# Patient Record
Sex: Male | Born: 1965 | Race: White | Hispanic: No | Marital: Single | Smoking: Current every day smoker
Health system: Southern US, Community
[De-identification: ages and names within clinical notes are randomized; demographics above are authoritative.]

## PROBLEM LIST (undated history)

## (undated) DIAGNOSIS — M1990 Unspecified osteoarthritis, unspecified site: Secondary | ICD-10-CM

## (undated) DIAGNOSIS — K4090 Unilateral inguinal hernia, without obstruction or gangrene, not specified as recurrent: Secondary | ICD-10-CM

## (undated) DIAGNOSIS — I252 Old myocardial infarction: Secondary | ICD-10-CM

## (undated) HISTORY — PX: HERNIA REPAIR: SHX51

---

## 2013-12-19 ENCOUNTER — Emergency Department (HOSPITAL_COMMUNITY)
Admission: EM | Admit: 2013-12-19 | Discharge: 2013-12-19 | Disposition: A | Discharge status: Home / Self Care | Payer: 59 | Attending: Emergency Medicine | Admitting: Emergency Medicine

## 2013-12-19 ENCOUNTER — Encounter (HOSPITAL_COMMUNITY): Payer: Self-pay | Admitting: Emergency Medicine

## 2013-12-19 ENCOUNTER — Emergency Department (HOSPITAL_COMMUNITY): Payer: 59

## 2013-12-19 DIAGNOSIS — G8929 Other chronic pain: Secondary | ICD-10-CM | POA: Insufficient documentation

## 2013-12-19 DIAGNOSIS — M549 Dorsalgia, unspecified: Secondary | ICD-10-CM | POA: Insufficient documentation

## 2013-12-19 DIAGNOSIS — R209 Unspecified disturbances of skin sensation: Secondary | ICD-10-CM | POA: Insufficient documentation

## 2013-12-19 DIAGNOSIS — R52 Pain, unspecified: Secondary | ICD-10-CM | POA: Insufficient documentation

## 2013-12-19 DIAGNOSIS — M129 Arthropathy, unspecified: Secondary | ICD-10-CM | POA: Insufficient documentation

## 2013-12-19 DIAGNOSIS — M25519 Pain in unspecified shoulder: Secondary | ICD-10-CM | POA: Insufficient documentation

## 2013-12-19 HISTORY — DX: Unspecified osteoarthritis, unspecified site: M19.90

## 2013-12-19 MED ORDER — HYDROCODONE-ACETAMINOPHEN 5-325 MG PO TABS: 1.0000 | ORAL_TABLET | Freq: Four times a day (QID) | ORAL | Status: DC | PRN

## 2013-12-19 MED ORDER — HYDROCODONE-ACETAMINOPHEN 5-325 MG PO TABS
2.0000 | ORAL_TABLET | Freq: Once | ORAL | Status: AC
  Administered 2013-12-19: 2 via ORAL
  Filled 2013-12-19: qty 2

## 2013-12-19 NOTE — ED Provider Notes (Signed)
CSN: [id number]     Arrival date & time 12/19/13  1824 History  This chart was scribed for Roxy Horsemanobert Edin Skarda, PA working with Flint MelterElliott L Wentz, MD, by Bronson CurbJacqueline Melvin, ED Scribe. This patient was seen in room TR10C/TR10C and the patient's care was started at 7:54 PM.    Chief Complaint  Patient presents with  . Shoulder Pain     The history is provided by the patient. No language interpreter was used.   HPI Comments: Kevin Diaz is a 48 y.o. male who presents to the Emergency Department with a chief complaint of moderate right shoulder pain that began today. He reports moving furniture earlier today and believes he the pain to be a result of over-exertion. Patient describes the pain as stabbing and radiates through his right upper back and down to his right wrist. There is associated tingling in his fingertips. He states he has taken Ibuprofen with liittle to no relief. He reports a history of similar shoulder pain throughout the years and stated 3 years ago was diagnosed with degenerative osteoporosis.   Past Medical History  Diagnosis Date  . Arthritis    No past surgical history on file. No family history on file. History  Substance Use Topics  . Smoking status: Not on file  . Smokeless tobacco: Not on file  . Alcohol Use: Not on file    Review of Systems  Constitutional: Negative for fever and chills.  Gastrointestinal:       No bowel incontinence  Genitourinary:       No urinary incontinence  Musculoskeletal: Positive for arthralgias, back pain and myalgias.  Neurological:       No saddle anesthesia      Allergies  Coconut fatty acids  Home Medications   Prior to Admission medications   Not on File   Triage Vitals: BP 118/60  Pulse 73  Temp(Src) 97.5 F (36.4 C) (Oral)  Resp 18  SpO2 95%  Physical Exam  Nursing note and vitals reviewed. Constitutional: He is oriented to person, place, and time. He appears well-developed and well-nourished. No distress.   HENT:  Head: Normocephalic and atraumatic.  Eyes: Conjunctivae and EOM are normal.  Neck: Normal range of motion. Neck supple. No tracheal deviation present.  Cardiovascular: Normal rate and intact distal pulses.   Brisk cap refill  Pulmonary/Chest: Effort normal. No respiratory distress.  Abdominal: He exhibits no distension.  Musculoskeletal: Normal range of motion.  Right shoulder ttp over the anterior aspect, ROM and strength is reduced 2/2 pain  Neurological: He is alert and oriented to person, place, and time.  Sensation intact  Skin: Skin is warm and dry.  Psychiatric: He has a normal mood and affect. His behavior is normal. Judgment and thought content normal.    ED Course  Procedures (including critical care time)  DIAGNOSTIC STUDIES: Oxygen Saturation is 95% on room air, adequate by my interpretation.    COORDINATION OF CARE: At 8:00 PM Discussed treatment plan with patient which includes Norco/Vicodin and shoulder immobilzer. Patient agrees.   Imaging Review Dg Shoulder Right  12/19/2013   CLINICAL DATA:  SHOULDER PAIN  EXAM: RIGHT SHOULDER - 2+ VIEW  COMPARISON:  None.  FINDINGS: There is no evidence of fracture or dislocation. Mild areas of hypertrophic spurring along the glenoid and undersurface of the acromion and distal clavicle.  IMPRESSION: Mild osteoarthritic changes.  No acute osseous abnormalities.   Electronically Signed   By: Salome HolmesHector  Cooper M.D.   On: 12/19/2013 19:59  MDM   Final diagnoses:  Shoulder pain    Patient with acute on chronic shoulder pain.  Plain films negative for acute injury.  Will give sling, pain meds, and ortho follow-up.  I personally performed the services described in this documentation, which was scribed in my presence. The recorded information has been reviewed and is accurate.     Roxy Horsemanobert Vamsi Apfel, PA-C 12/19/13 2015  Roxy Horsemanobert Kayhan Boardley, PA-C 12/19/13 2016

## 2013-12-19 NOTE — Discharge Instructions (Signed)
Acromioclavicular Injuries  The AC (acromioclavicular) joint is the joint in the shoulder where the collarbone (clavicle) meets the shoulder blade (scapula). The part of the shoulder blade connected to the collarbone is called the acromion. Common problems with and treatments for the AC joint are detailed below.  ARTHRITIS  Arthritis occurs when the joint has been injured and the smooth padding between the joints (cartilage) is lost. This is the wear and tear seen in most joints of the body if they have been overused. This causes the joint to produce pain and swelling which is worse with activity.   AC JOINT SEPARATION  AC joint separation means that the ligaments connecting the acromion of the shoulder blade and collarbone have been damaged, and the two bones no longer line up. AC separations can be anywhere from mild to severe, and are "graded" depending upon which ligaments are torn and how badly they are torn.   Grade I Injury: the least damage is done, and the AC joint still lines up.   Grade II Injury: damage to the ligaments which reinforce the AC joint. In a Grade II injury, these ligaments are stretched but not entirely torn. When stressed, the AC joint becomes painful and unstable.   Grade III Injury: AC and secondary ligaments are completely torn, and the collarbone is no longer attached to the shoulder blade. This results in deformity; a prominence of the end of the clavicle.  AC JOINT FRACTURE  AC joint fracture means that there has been a break in the bones of the AC joint, usually the end of the clavicle.  TREATMENT  TREATMENT OF AC ARTHRITIS   There is currently no way to replace the cartilage damaged by arthritis. The best way to improve the condition is to decrease the activities which aggravate the problem. Application of ice to the joint helps decrease pain and soreness (inflammation). The use of non-steroidal anti-inflammatory medication is helpful.   If less conservative measures do not  work, then cortisone shots (injections) may be used. These are anti-inflammatories; they decrease the soreness in the joint and swelling.   If non-surgical measures fail, surgery may be recommended. The procedure is generally removal of a portion of the end of the clavicle. This is the part of the collarbone closest to your acromion which is stabilized with ligaments to the acromion of the shoulder blade. This surgery may be performed using a tube-like instrument with a light (arthroscope) for looking into a joint. It may also be performed as an open surgery through a small incision by the surgeon. Most patients will have good range of motion within 6 weeks and may return to all activity including sports by 8-12 weeks, barring complications.  TREATMENT OF AN AC SEPARATION   The initial treatment is to decrease pain. This is best accomplished by immobilizing the arm in a sling and placing an ice pack to the shoulder for 20 to 30 minutes every 2 hours as needed. As the pain starts to subside, it is important to begin moving the fingers, wrist, elbow and eventually the shoulder in order to prevent a stiff or "frozen" shoulder. Instruction on when and how much to move the shoulder will be provided by your caregiver. The length of time needed to regain full motion and function depends on the amount or grade of the injury. Recovery from a Grade I AC separation usually takes 10 to 14 days, whereas a Grade III may take 6 to 8 weeks.   Grade   I and II separations usually do not require surgery. Even Grade III injuries usually allow return to full activity with few restrictions. Treatment is also based on the activity demands of the injured shoulder. For example, a high level quarterback with an injured throwing arm will receive more aggressive treatment than someone with a desk job who rarely uses his/her arm for strenuous activities. In some cases, a painful lump may persist which could require a later surgery. Surgery  can be very successful, but the benefits must be weighed against the potential risks.  TREATMENT OF AN AC JOINT FRACTURE  Fracture treatment depends on the type of fracture. Sometimes a splint or sling may be all that is required. Other times surgery may be required for repair. This is more frequently the case when the ligaments supporting the clavicle are completely torn. Your caregiver will help you with these decisions and together you can decide what will be the best treatment.  HOME CARE INSTRUCTIONS    Apply ice to the injury for 15-20 minutes each hour while awake for 2 days. Put the ice in a plastic bag and place a towel between the bag of ice and skin.   If a sling has been applied, wear it constantly for as long as directed by your caregiver, even at night. The sling or splint can be removed for bathing or showering or as directed. Be sure to keep the shoulder in the same place as when the sling is on. Do not lift the arm.   If a figure-of-eight splint has been applied it should be tightened gently by another person every day. Tighten it enough to keep the shoulders held back. Allow enough room to place the index finger between the body and strap. Loosen the splint immediately if there is numbness or tingling in the hands.   Take over-the-counter or prescription medicines for pain, discomfort or fever as directed by your caregiver.   If you or your child has received a follow up appointment, it is very important to keep that appointment in order to avoid long term complications, chronic pain or disability.  SEEK MEDICAL CARE IF:    The pain is not relieved with medications.   There is increased swelling or discoloration that continues to get worse rather than better.   You or your child has been unable to follow up as instructed.   There is progressive numbness and tingling in the arm, forearm or hand.  SEEK IMMEDIATE MEDICAL CARE IF:    The arm is numb, cold or pale.   There is increasing pain  in the hand, forearm or fingers.  MAKE SURE YOU:    Understand these instructions.   Will watch your condition.   Will get help right away if you are not doing well or get worse.  Document Released: 05/18/2005 Document Revised: 10/31/2011 Document Reviewed: 11/10/2008  Exit[employer] Patient Information 2014 [employer].

## 2013-12-19 NOTE — ED Notes (Signed)
Pt reports R shoulder pain today; has bilateral shoulder pain chronically. Reports he has taken ibuprofen and muscle relaxers and that usually helps. Was moving furniture today and thinks he over did it. Reports he can barely move his shoulder due to pain and is having tingling in fingertips.

## 2013-12-20 NOTE — ED Provider Notes (Signed)
Medical screening examination/treatment/procedure(s) were performed by non-physician practitioner and as supervising physician I was immediately available for consultation/collaboration.  Flint MelterElliott L Paulanthony Gleaves, MD 12/20/13 0040

## 2014-11-23 ENCOUNTER — Encounter (HOSPITAL_COMMUNITY): Payer: Self-pay

## 2014-11-23 ENCOUNTER — Emergency Department (HOSPITAL_COMMUNITY)
Admission: EM | Admit: 2014-11-23 | Discharge: 2014-11-23 | Disposition: A | Discharge status: Home / Self Care | Payer: Self-pay | Attending: Emergency Medicine | Admitting: Emergency Medicine

## 2014-11-23 DIAGNOSIS — M1990 Unspecified osteoarthritis, unspecified site: Secondary | ICD-10-CM | POA: Insufficient documentation

## 2014-11-23 DIAGNOSIS — Z79899 Other long term (current) drug therapy: Secondary | ICD-10-CM | POA: Insufficient documentation

## 2014-11-23 DIAGNOSIS — K4090 Unilateral inguinal hernia, without obstruction or gangrene, not specified as recurrent: Secondary | ICD-10-CM | POA: Insufficient documentation

## 2014-11-23 DIAGNOSIS — Z720 Tobacco use: Secondary | ICD-10-CM | POA: Insufficient documentation

## 2014-11-23 NOTE — Discharge Instructions (Signed)
Hernia °A hernia occurs when an internal organ pushes out through a weak spot in the abdominal wall. Hernias most commonly occur in the groin and around the navel. Hernias often can be pushed back into place (reduced). Most hernias tend to get worse over time. Some abdominal hernias can get stuck in the opening (irreducible or incarcerated hernia) and cannot be reduced. An irreducible abdominal hernia which is tightly squeezed into the opening is at risk for impaired blood supply (strangulated hernia). A strangulated hernia is a medical emergency. Because of the risk for an irreducible or strangulated hernia, surgery may be recommended to repair a hernia. °CAUSES  °· Heavy lifting. °· Prolonged coughing. °· Straining to have a bowel movement. °· A cut (incision) made during an abdominal surgery. °HOME CARE INSTRUCTIONS  °· Bed rest is not required. You may continue your normal activities. °· Avoid lifting more than 10 pounds (4.5 kg) or straining. °· Cough gently. If you are a smoker it is best to stop. Even the best hernia repair can break down with the continual strain of coughing. Even if you do not have your hernia repaired, a cough will continue to aggravate the problem. °· Do not wear anything tight over your hernia. Do not try to keep it in with an outside bandage or truss. These can damage abdominal contents if they are trapped within the hernia sac. °· Eat a normal diet. °· Avoid constipation. Straining over long periods of time will increase hernia size and encourage breakdown of repairs. If you cannot do this with diet alone, stool softeners may be used. °SEEK IMMEDIATE MEDICAL CARE IF:  °· You have a fever. °· You develop increasing abdominal pain. °· You feel nauseous or vomit. °· Your hernia is stuck outside the abdomen, looks discolored, feels hard, or is tender. °· You have any changes in your bowel habits or in the hernia that are unusual for you. °· You have increased pain or swelling around the  hernia. °· You cannot push the hernia back in place by applying gentle pressure while lying down. °MAKE SURE YOU:  °· Understand these instructions. °· Will watch your condition. °· Will get help right away if you are not doing well or get worse. °Document Released: 08/08/2005 Document Revised: 10/31/2011 Document Reviewed: 03/27/2008 °[employer]® Patient Information ©2015 [employer]. This information is not intended to replace advice given to you by your health care provider. Make sure you discuss any questions you have with your health care provider. ° °Inguinal Hernia, Adult °Muscles help keep everything in the body in its proper place. But if a weak spot in the muscles develops, something can poke through. That is called a hernia. When this happens in the lower part of the belly (abdomen), it is called an inguinal hernia. (It takes its name from a part of the body in this region called the inguinal canal.) A weak spot in the wall of muscles lets some fat or part of the small intestine bulge through. An inguinal hernia can develop at any age. Men get them more often than women. °CAUSES  °In adults, an inguinal hernia develops over time. °· It can be triggered by: °¨ Suddenly straining the muscles of the lower abdomen. °¨ Lifting heavy objects. °¨ Straining to have a bowel movement. Difficult bowel movements (constipation) can lead to this. °¨ Constant coughing. This may be caused by smoking or lung disease. °¨ Being overweight. °¨ Being pregnant. °¨ Working at a job that requires long   periods of standing or heavy lifting. °¨ Having had an inguinal hernia before. °One type can be an emergency situation. It is called a strangulated inguinal hernia. It develops if part of the small intestine slips through the weak spot and cannot get back into the abdomen. The blood supply can be cut off. If that happens, part of the intestine may die. This situation requires emergency surgery. °SYMPTOMS  °Often, a small inguinal  hernia has no symptoms. It is found when a healthcare provider does a physical exam. Larger hernias usually have symptoms.  °· In adults, symptoms may include: °¨ A lump in the groin. This is easier to see when the person is standing. It might disappear when lying down. °¨ In men, a lump in the scrotum. °¨ Pain or burning in the groin. This occurs especially when lifting, straining or coughing. °¨ A dull ache or feeling of pressure in the groin. °· Signs of a strangulated hernia can include: °¨ A bulge in the groin that becomes very painful and tender to the touch. °¨ A bulge that turns red or purple. °¨ Fever, nausea and vomiting. °¨ Inability to have a bowel movement or to pass gas. °DIAGNOSIS  °To decide if you have an inguinal hernia, a healthcare provider will probably do a physical examination. °· This will include asking questions about any symptoms you have noticed. °· The healthcare provider might feel the groin area and ask you to cough. If an inguinal hernia is felt, the healthcare provider may try to slide it back into the abdomen. °· Usually no other tests are needed. °TREATMENT  °Treatments can vary. The size of the hernia makes a difference. Options include: °· Watchful waiting. This is often suggested if the hernia is small and you have had no symptoms. °¨ No medical procedure will be done unless symptoms develop. °¨ You will need to watch closely for symptoms. If any occur, contact your healthcare provider right away. °· Surgery. This is used if the hernia is larger or you have symptoms. °¨ Open surgery. This is usually an outpatient procedure (you will not stay overnight in a hospital). An cut (incision) is made through the skin in the groin. The hernia is put back inside the abdomen. The weak area in the muscles is then repaired by herniorrhaphy or hernioplasty. Herniorrhaphy: in this type of surgery, the weak muscles are sewn back together. Hernioplasty: a patch or mesh is used to close the weak  area in the abdominal wall. °¨ Laparoscopy. In this procedure, a surgeon makes small incisions. A thin tube with a tiny video camera (called a laparoscope) is put into the abdomen. The surgeon repairs the hernia with mesh by looking with the video camera and using two long instruments. °HOME CARE INSTRUCTIONS  °· After surgery to repair an inguinal hernia: °¨ You will need to take pain medicine prescribed by your healthcare provider. Follow all directions carefully. °¨ You will need to take care of the wound from the incision. °¨ Your activity will be restricted for awhile. This will probably include no heavy lifting for several weeks. You also should not do anything too active for a few weeks. When you can return to work will depend on the type of job that you have. °· During "watchful waiting" periods, you should: °¨ Maintain a healthy weight. °¨ Eat a diet high in fiber (fruits, vegetables and whole grains). °¨ Drink plenty of fluids to avoid constipation. This means drinking enough water and other   liquids to keep your urine clear or pale yellow. °¨ Do not lift heavy objects. °¨ Do not stand for long periods of time. °¨ Quit smoking. This should keep you from developing a frequent cough. °SEEK MEDICAL CARE IF:  °· A bulge develops in your groin area. °· You feel pain, a burning sensation or pressure in the groin. This might be worse if you are lifting or straining. °· You develop a fever of more than 100.5° F (38.1° C). °SEEK IMMEDIATE MEDICAL CARE IF:  °· Pain in the groin increases suddenly. °· A bulge in the groin gets bigger suddenly and does not go down. °· For men, there is sudden pain in the scrotum. Or, the size of the scrotum increases. °· A bulge in the groin area becomes red or purple and is painful to touch. °· You have nausea or vomiting that does not go away. °· You feel your heart beating much faster than normal. °· You cannot have a bowel movement or pass gas. °· You develop a fever of more than  102.0° F (38.9° C). °Document Released: 12/25/2008 Document Revised: 10/31/2011 Document Reviewed: 12/25/2008 °ExitCare® Patient Information ©2015 [employer]. This information is not intended to replace advice given to you by your health care provider. Make sure you discuss any questions you have with your health care provider. ° °

## 2014-11-23 NOTE — ED Notes (Signed)
Pt reports inguinal hernia x 2 weeks. Pain worse with coughing and standing. Reports it is reducible. Pt denies pain currently. NAD.

## 2014-11-23 NOTE — ED Provider Notes (Signed)
CSN: [id number]     Arrival date & time 11/23/14  1152 History   First MD Initiated Contact with Patient 11/23/14 1219     Chief Complaint  Patient presents with  . Inguinal Hernia     (Consider location/radiation/quality/duration/timing/severity/associated sxs/prior Treatment) HPI Comments: Patient here complaining of left inguinal pain 2 weeks. Symptoms worse with coughing or standing. He is able to push the bulge back in. No vomiting or diarrhea. No severe abdominal pain. No fever or chills. Is currently asymptomatic  The history is provided by the patient.    Past Medical History  Diagnosis Date  . Arthritis    History reviewed. No pertinent past surgical history. No family history on file. History  Substance Use Topics  . Smoking status: Current Every Day Smoker -- 1.00 packs/day    Types: Cigarettes  . Smokeless tobacco: Not on file  . Alcohol Use: No    Review of Systems  All other systems reviewed and are negative.     Allergies  Coconut fatty acids  Home Medications   Prior to Admission medications   Medication Sig Start Date End Date Taking? Authorizing Provider  HYDROcodone-acetaminophen (NORCO/VICODIN) 5-325 MG per tablet Take 1-2 tablets by mouth every 6 (six) hours as needed. 12/19/13   Roxy Horsemanobert Browning, PA-C   BP 112/55 mmHg  Pulse 91  Temp(Src) 97.6 F (36.4 C) (Oral)  Resp 16  Ht 5' 10.5" (1.791 m)  Wt 147 lb 3 oz (66.764 kg)  BMI 20.81 kg/m2  SpO2 97% Physical Exam  Constitutional: He is oriented to person, place, and time. He appears well-developed and well-nourished.  Non-toxic appearance. No distress.  HENT:  Head: Normocephalic and atraumatic.  Eyes: Conjunctivae, EOM and lids are normal. Pupils are equal, round, and reactive to light.  Neck: Normal range of motion. Neck supple. No tracheal deviation present. No thyroid mass present.  Cardiovascular: Normal rate, regular rhythm and normal heart sounds.  Exam reveals no gallop.   No  murmur heard. Pulmonary/Chest: Effort normal and breath sounds normal. No stridor. No respiratory distress. He has no decreased breath sounds. He has no wheezes. He has no rhonchi. He has no rales.  Abdominal: Soft. Normal appearance and bowel sounds are normal. He exhibits no distension. There is no tenderness. There is no rigidity, no rebound, no guarding and no CVA tenderness.  Genitourinary: Testes normal and penis normal. Cremasteric reflex is present.  Musculoskeletal: Normal range of motion. He exhibits no edema or tenderness.  Neurological: He is alert and oriented to person, place, and time. He has normal strength. No cranial nerve deficit or sensory deficit. GCS eye subscore is 4. GCS verbal subscore is 5. GCS motor subscore is 6.  Skin: Skin is warm and dry. No abrasion and no rash noted.  Psychiatric: He has a normal mood and affect. His speech is normal and behavior is normal.  Nursing note and vitals reviewed.   ED Course  Procedures (including critical care time) Labs Review Labs Reviewed - No data to display  Imaging Review No results found.   EKG Interpretation None      MDM   Final diagnoses:  None    Patient without evidence of incarcerated hernia at this time. Does have a mass with coughing in his left inguinal canal which likely represents a hernia. Will be given general surgery referral as well as return precautions    Lorre NickAnthony Reema Chick, MD 11/23/14 1306

## 2015-02-09 ENCOUNTER — Emergency Department (HOSPITAL_COMMUNITY)
Admission: EM | Admit: 2015-02-09 | Discharge: 2015-02-09 | Disposition: A | Discharge status: Home / Self Care | Payer: Self-pay | Attending: Emergency Medicine | Admitting: Emergency Medicine

## 2015-02-09 ENCOUNTER — Encounter (HOSPITAL_COMMUNITY): Payer: Self-pay | Admitting: Family Medicine

## 2015-02-09 DIAGNOSIS — K4090 Unilateral inguinal hernia, without obstruction or gangrene, not specified as recurrent: Secondary | ICD-10-CM | POA: Insufficient documentation

## 2015-02-09 DIAGNOSIS — M1990 Unspecified osteoarthritis, unspecified site: Secondary | ICD-10-CM | POA: Insufficient documentation

## 2015-02-09 DIAGNOSIS — Z720 Tobacco use: Secondary | ICD-10-CM | POA: Insufficient documentation

## 2015-02-09 MED ORDER — DOCUSATE SODIUM 100 MG PO CAPS
100.0000 mg | ORAL_CAPSULE | Freq: Two times a day (BID) | ORAL | Status: AC
Start: 2015-02-09 — End: ?

## 2015-02-09 MED ORDER — HYDROCODONE-ACETAMINOPHEN 5-325 MG PO TABS: 1.0000 | ORAL_TABLET | Freq: Four times a day (QID) | ORAL | Status: AC | PRN

## 2015-02-09 NOTE — Discharge Instructions (Signed)

## 2015-02-09 NOTE — ED Provider Notes (Signed)
CSN: [id number]     Arrival date & time 02/09/15  1314 History   First MD Initiated Contact with Patient 02/09/15 1456     Chief Complaint  Patient presents with  . Inguinal Hernia     (Consider location/radiation/quality/duration/timing/severity/associated sxs/prior Treatment) HPI  49 year old male presents with progressively worse left inguinal hernia pain. He has had this hernia for about 3 months but over the past 1 month has been worsening. He states it is constantly out and is causing him pain. He did vomit twice last night but no vomiting today. Is having normal bowel movements but has significant pain with bowel movements. Last bowel movement this morning. He occasionally takes ibuprofen but has been trying not to take anything. He was referred to general surgery as an outpatient but due to [facility] he has not been able to arrange follow-up as he is still in the insurance getting phase. Stands on his feet for 8 hours at work which increases pain. Coughing also increases pain.  Past Medical History  Diagnosis Date  . Arthritis    History reviewed. No pertinent past surgical history. History reviewed. No pertinent family history. History  Substance Use Topics  . Smoking status: Current Every Day Smoker -- 1.00 packs/day    Types: Cigarettes  . Smokeless tobacco: Not on file  . Alcohol Use: No    Review of Systems  Constitutional: Negative for fever.  Gastrointestinal: Positive for vomiting. Negative for nausea, abdominal pain, diarrhea and constipation.  Genitourinary: Negative for dysuria, scrotal swelling and testicular pain.  All other systems reviewed and are negative.     Allergies  Coconut fatty acids  Home Medications   Prior to Admission medications   Medication Sig Start Date End Date Taking? Authorizing Provider  ibuprofen (ADVIL,MOTRIN) 200 MG tablet Take 200 mg by mouth every 6 (six) hours as needed for moderate pain.   Yes Historical Provider, MD   docusate sodium (COLACE) 100 MG capsule Take 1 capsule (100 mg total) by mouth every 12 (twelve) hours. 02/09/15   Pricilla Loveless, MD  HYDROcodone-acetaminophen (NORCO) 5-325 MG per tablet Take 1 tablet by mouth every 6 (six) hours as needed for severe pain. 02/09/15   Pricilla Loveless, MD   BP 104/59 mmHg  Pulse 59  Temp(Src) 98 F (36.7 C) (Oral)  Resp 18  SpO2 99% Physical Exam  Constitutional: He is oriented to person, place, and time. He appears well-developed and well-nourished.  HENT:  Head: Normocephalic and atraumatic.  Right Ear: External ear normal.  Left Ear: External ear normal.  Nose: Nose normal.  Eyes: Right eye exhibits no discharge. Left eye exhibits no discharge.  Neck: Neck supple.  Cardiovascular: Normal rate.   Pulmonary/Chest: Effort normal.  Abdominal: Soft. He exhibits no distension. There is no tenderness. A hernia is present. Hernia confirmed positive in the left inguinal area.  Genitourinary: Penis normal. Left testis shows no mass, no swelling and no tenderness. Circumcised.  Left inguinal hernia is fully reduced when lying flat. When standing or sitting up his hernia appears but is easily reducible. No redness or firmness.  Musculoskeletal: He exhibits no edema.  Neurological: He is alert and oriented to person, place, and time.  Skin: Skin is warm and dry.  Nursing note and vitals reviewed.   ED Course  Procedures (including critical care time) Labs Review Labs Reviewed - No data to display  Imaging Review No results found.   EKG Interpretation None      MDM  Final diagnoses:  Left inguinal hernia    Patient is having increased pain over his left hernia but the hernia is currently not incarcerated. It seems that this hernia has progressed but there is no emergent indication for surgery. I've discussed the importance of a good bowel regimen to help reduce pain with bowel movements. Discussed also using ibuprofen as needed and will give a  prescription of hydrocodone for severe pain if at night. I discussed the strict return precautions including severe pain, vomiting, redness over the site, or inability to reduce hernia. Stable for discharge, stressed importance of follow-up with surgery.    Pricilla Loveless, MD 02/09/15 1530

## 2015-02-09 NOTE — ED Notes (Signed)
Pt here for left groin pain due to hernia. sts hurts and hard to walk, move. sts nausea

## 2016-04-09 ENCOUNTER — Emergency Department (HOSPITAL_COMMUNITY): Payer: Self-pay

## 2016-04-09 ENCOUNTER — Observation Stay (HOSPITAL_COMMUNITY)
Admission: EM | Admit: 2016-04-09 | Discharge: 2016-04-10 | Disposition: A | Discharge status: Home / Self Care | Payer: Self-pay | Attending: Oncology | Admitting: Oncology

## 2016-04-09 ENCOUNTER — Encounter (HOSPITAL_COMMUNITY): Payer: Self-pay | Admitting: Emergency Medicine

## 2016-04-09 DIAGNOSIS — M19011 Primary osteoarthritis, right shoulder: Secondary | ICD-10-CM | POA: Insufficient documentation

## 2016-04-09 DIAGNOSIS — I252 Old myocardial infarction: Secondary | ICD-10-CM | POA: Insufficient documentation

## 2016-04-09 DIAGNOSIS — F17210 Nicotine dependence, cigarettes, uncomplicated: Secondary | ICD-10-CM | POA: Insufficient documentation

## 2016-04-09 DIAGNOSIS — F17200 Nicotine dependence, unspecified, uncomplicated: Secondary | ICD-10-CM | POA: Insufficient documentation

## 2016-04-09 DIAGNOSIS — R079 Chest pain, unspecified: Principal | ICD-10-CM

## 2016-04-09 DIAGNOSIS — M19012 Primary osteoarthritis, left shoulder: Secondary | ICD-10-CM | POA: Insufficient documentation

## 2016-04-09 HISTORY — DX: Old myocardial infarction: I25.2

## 2016-04-09 HISTORY — DX: Unilateral inguinal hernia, without obstruction or gangrene, not specified as recurrent: K40.90

## 2016-04-09 LAB — BASIC METABOLIC PANEL
Anion gap: 7 (ref 5–15)
BUN: 16 mg/dL (ref 6–20)
CALCIUM: 8.6 mg/dL — AB (ref 8.9–10.3)
CO2: 25 mmol/L (ref 22–32)
CREATININE: 0.77 mg/dL (ref 0.61–1.24)
Chloride: 105 mmol/L (ref 101–111)
GFR calc Af Amer: >60 mL/min (ref >60)
GFR calc non Af Amer: >60 mL/min (ref >60)
GLUCOSE: 87 mg/dL (ref 65–99)
Potassium: 3.4 mmol/L — ABNORMAL LOW (ref 3.5–5.1)
Sodium: 137 mmol/L (ref 135–145)

## 2016-04-09 LAB — CBC
HCT: 38.9 % — ABNORMAL LOW (ref 39.0–52.0)
Hemoglobin: 12.9 g/dL — ABNORMAL LOW (ref 13.0–17.0)
MCH: 30.7 pg (ref 26.0–34.0)
MCHC: 33.2 g/dL (ref 30.0–36.0)
MCV: 92.6 fL (ref 78.0–100.0)
PLATELETS: 194 10*3/uL (ref 150–400)
RBC: 4.2 MIL/uL — AB (ref 4.22–5.81)
RDW: 12.4 % (ref 11.5–15.5)
WBC: 7.9 10*3/uL (ref 4.0–10.5)

## 2016-04-09 LAB — I-STAT TROPONIN, ED: TROPONIN I, POC: 0 ng/mL (ref 0.00–0.08)

## 2016-04-09 MED ORDER — SODIUM CHLORIDE 0.9 % IV BOLUS (SEPSIS)
1000.0000 mL | Freq: Once | INTRAVENOUS | Status: AC
  Administered 2016-04-09: 1000 mL via INTRAVENOUS

## 2016-04-09 MED ORDER — ENOXAPARIN SODIUM 40 MG/0.4ML ~~LOC~~ SOLN: 40.0000 mg | Freq: Every day | SUBCUTANEOUS | Status: DC

## 2016-04-09 MED ORDER — ONDANSETRON HCL 4 MG PO TABS: 4.0000 mg | ORAL_TABLET | Freq: Four times a day (QID) | ORAL | Status: DC | PRN

## 2016-04-09 MED ORDER — SODIUM CHLORIDE 0.9% FLUSH: 3.0000 mL | INTRAVENOUS | Status: DC | PRN

## 2016-04-09 MED ORDER — ACETAMINOPHEN 325 MG PO TABS: 650.0000 mg | ORAL_TABLET | Freq: Four times a day (QID) | ORAL | Status: DC | PRN

## 2016-04-09 MED ORDER — SENNOSIDES-DOCUSATE SODIUM 8.6-50 MG PO TABS
1.0000 | ORAL_TABLET | Freq: Every evening | ORAL | Status: DC | PRN
  Filled 2016-04-09: qty 1

## 2016-04-09 MED ORDER — ENOXAPARIN SODIUM 40 MG/0.4ML ~~LOC~~ SOLN: 40.0000 mg | SUBCUTANEOUS | Status: DC

## 2016-04-09 MED ORDER — ASPIRIN EC 81 MG PO TBEC
81.0000 mg | DELAYED_RELEASE_TABLET | Freq: Every day | ORAL | Status: DC
  Administered 2016-04-10: 81 mg via ORAL
  Filled 2016-04-09: qty 1

## 2016-04-09 MED ORDER — SODIUM CHLORIDE 0.9 % IV SOLN: 250.0000 mL | INTRAVENOUS | Status: DC | PRN

## 2016-04-09 MED ORDER — ACETAMINOPHEN 650 MG RE SUPP: 650.0000 mg | Freq: Four times a day (QID) | RECTAL | Status: DC | PRN

## 2016-04-09 MED ORDER — SODIUM CHLORIDE 0.9% FLUSH
3.0000 mL | Freq: Two times a day (BID) | INTRAVENOUS | Status: DC
  Administered 2016-04-10: 3 mL via INTRAVENOUS

## 2016-04-09 MED ORDER — ONDANSETRON HCL 4 MG/2ML IJ SOLN: 4.0000 mg | Freq: Four times a day (QID) | INTRAMUSCULAR | Status: DC | PRN

## 2016-04-09 MED ORDER — SODIUM CHLORIDE 0.9% FLUSH: 3.0000 mL | Freq: Two times a day (BID) | INTRAVENOUS | Status: DC

## 2016-04-09 NOTE — H&P (Signed)
Date: 04/09/2016               Patient Name:  Kevin Diaz MRN: [id number]  DOB: 1965/12/25 Age / Sex: 50 y.o., male   PCP: No Pcp Per Patient         Medical Service: Internal Medicine Teaching Service         Attending Physician: Dr. Levert FeinsteinJames M Granfortuna, MD    First Contact: Dr. Samuella CotaSvalina Pager: [phone]  Second Contact: Dr. Tasia CatchingsAhmed Pager: [phone]       After Hours (After 5p/  First Contact Pager: [phone]  weekends / holidays): Second Contact Pager: [phone]   Chief Complaint: Chest pain  History of Present Illness: Mr Tasia CatchingsZayack is a 50 year old man with CAD (MI in 2014, no cath) who presents with acute exertional chest pain.  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  No radiation to arm or jaw.  He sat down inside and became more dizzy.  The pain gradually subsided to 3/10, and he was transported to the ED by EMS.  Received 324 mg aspiring by EMS.  In the ED, he was given 1L NS and has been asymptomatic.  Before this evening he was in his usually state of health.  He is always active running around at his job, and has not been having any CP or SOB.  He reports baseline anxiety and stress at work.  Reports a stress [place] and was normal.  Cannot remember where it was performed or who ordered.  He reports an MI in 2014 which was [facility] in [place].  He was not catheterized, but was given a medicine which he cannot recall the name of to take for 3 months afterwards.  He also remembers being told he had an MI as a neonate, but does not known the details since he is adopted.  In 2015 he had an episode of chest pain which he was told was due to a panic attack.  That pain was  different, feeling like an intense outwards pressure from inside his chest.   Meds:  Current Meds  Medication Sig  . ibuprofen (ADVIL,MOTRIN) 200 MG tablet Take 200 mg by mouth every 6 (six) hours as needed for moderate pain.     Allergies: Allergies as of 04/09/2016 - Review Complete 04/09/2016  Allergen Reaction Noted  . Coconut fatty acids  12/19/2013   Past Medical History:  Diagnosis Date  . Arthritis   . Inguinal hernia   . MI, old     Family History: adopted, does not know parent's medical history.  Son with VSD.  Social History: Current 1 pk per day smoker, 40 pk year history.  No alcohol or drugs.  Review of Systems: A complete ROS was negative except as per HPI.  Physical Exam: Blood pressure 109/75, pulse 65, temperature 97.6 F (36.4 C), temperature source Oral, resp. rate 19, height 5\' 10"  (1.778 m), weight 147 lb (66.7 kg), SpO2 98 %.  Physical Exam  Constitutional: He is oriented to person, place, and time.  Thin, in no distress  HENT:  Head: Normocephalic and atraumatic.  Poor dentition  Eyes: Conjunctivae are normal. No scleral icterus.  Neck: Normal range of motion. Neck  supple. No JVD present.  Cardiovascular: Normal rate, regular rhythm, normal heart sounds and intact distal pulses.  Exam reveals no gallop and no friction rub.   No murmur heard. Pulmonary/Chest: Effort normal and breath sounds normal. No respiratory distress. He has no wheezes. He has no rales. He exhibits no tenderness.  Abdominal: Soft. He exhibits no distension. There is no tenderness.  Small L inguinal hernia, non-tender, easily reducible  Musculoskeletal: He exhibits no edema, tenderness or deformity.  Lymphadenopathy:    He has no cervical adenopathy.  Neurological: He is alert and oriented to person, place, and time. No cranial nerve deficit. He exhibits normal muscle tone.  Skin: Skin is warm and dry. Capillary refill takes less than 2 seconds. No rash noted. No erythema.    Psychiatric: He has a normal mood and affect. His behavior is normal.   EKG (x2 in ED): Sinus brady, normal axis, TWI in aVL, no Q waves, no ST changes.  No priors.  CXR: no acute cardiopulmonary abnormality  Troponin [facility](Point of Care Test)  Recent Labs  04/09/16 2058  TROPIPOC 0.00   CBC Latest Ref Rng & Units 04/09/2016  WBC 4.0 - 10.5 K/uL 7.9  Hemoglobin 13.0 - 17.0 g/dL 12.9(L)  Hematocrit 39.0 - 52.0 % 38.9(L)  Platelets 150 - 400 K/uL 194   BMP Latest Ref Rng & Units 04/09/2016  Glucose 65 - 99 mg/dL 87  BUN 6 - 20 mg/dL 16  Creatinine 9.140.61 - 7.821.24 mg/dL 9.560.77  Sodium 213135 - 086145 mmol/L 137  Potassium 3.5 - 5.1 mmol/L 3.4(L)  Chloride 101 - 111 mmol/L 105  CO2 22 - 32 mmol/L 25  Calcium 8.9 - 10.3 mg/dL 5.7(Q8.6(L)      Assessment & Plan by Problem: Active Problems:   Chest pain  Acute exertional chest pain in patient with reported history of CAD is concerning for ACS.  With endorsed anxiety and a prior panic attack, anxiety is on the differential as well.  Other etiologies including aortic dissection (normal BP, symmetrical radial pulses, normal CXR), PE (Wells 0), GERD (no association with food or position, no taste, no prior reflux) and costochondritis (no tenderness) were considered and deemed unlikely.   #Chest Pain Currently asymptomatic with normal vitals. -Trend troponins -Repeat EKG in AM  Dispo: Admit patient to Observation with expected length of stay less than 2 midnights.  Signed: Alm BustardMatthew O'Sullivan, MD 04/09/2016, 10:00 PM  Pager: [phone]

## 2016-04-09 NOTE — ED Notes (Signed)
In xray at this time.

## 2016-04-09 NOTE — ED Triage Notes (Signed)
Brought by EMS for c/o substernal chest pain that radiated to left arm.  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  Around 1930 started having sudden stabbing pain that turned to pressure.  Hx of MI 2-3 years ago.  SBP initially 100 so no ntg given.  Given asa 324mg  and 500cc NS bolus. Now rating pain at 3/10.

## 2016-04-09 NOTE — ED Provider Notes (Signed)
[facility]-EMERGENCY DEPT Provider Note   CSN: [id number] Arrival date & time: 04/09/16  2035     History   Chief Complaint Chief Complaint  Patient presents with  . Chest Pain    HPI Kevin Diaz is a 50 y.o. male.  Patient presents to the ED with a chief complaint of chest pain.  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  He states that he began having chest pain while he was performing his job today. He states that the pain was located in the central to left side of his chest. States that it radiated to his left arm. He denies feeling short of breath. He states that he was previously diaphoretic from the activity. He states that he felt like he was going to pass out. He reports prior history of MI 2-3 years ago. He has received aspirin by EMS.  He states that he feels like he is having a panic attack due to the heat and stress of work. There are no other associated symptoms. He is feeling improved now.   The history is provided by the patient. No language interpreter was used.    Past Medical History:  Diagnosis Date  . Arthritis   . Inguinal hernia   . MI, old     There are no active problems to display for this patient.   History reviewed. No pertinent surgical history.     Home Medications    Prior to Admission medications   Medication Sig Start Date End Date Taking? Authorizing Provider  docusate sodium (COLACE) 100 MG capsule Take 1 capsule (100 mg total) by mouth every 12 (twelve) hours. 02/09/15   Pricilla LovelessScott Goldston, MD  HYDROcodone-acetaminophen (NORCO) 5-325 MG per tablet Take 1 tablet by mouth every 6 (six) hours as needed for severe pain. 02/09/15   Pricilla LovelessScott Goldston, MD  ibuprofen (ADVIL,MOTRIN) 200 MG tablet Take 200 mg by mouth every 6 (six) hours as needed for moderate pain.    Historical Provider, MD    Family History No family history on file.  Social History Social History  Substance Use  Topics  . Smoking status: Current Every Day Smoker    Packs/day: 1.00    Types: Cigarettes  . Smokeless tobacco: Never Used  . Alcohol use No     Allergies   Coconut fatty acids   Review of Systems Review of Systems  Respiratory: Positive for shortness of breath.   Cardiovascular: Positive for chest pain.  All other systems reviewed and are negative.    Physical Exam Updated Vital Signs BP 103/64   Pulse (!) 59   Temp 97.6 F (36.4 C) (Oral)   Resp 18   Ht 5\' 10"  (1.778 m)   Wt 66.7 kg   SpO2 96%   BMI 21.09 kg/m   Physical Exam  Constitutional: He is oriented to person, place, and time. He appears well-developed and well-nourished.  HENT:  Head: Normocephalic and atraumatic.  Eyes: Conjunctivae and EOM are normal. Pupils are equal, round, and reactive to light. Right eye exhibits no discharge. Left eye exhibits no discharge. No scleral icterus.  Neck: Normal range of motion. Neck supple. No JVD present.  Cardiovascular: Normal rate, regular rhythm and normal heart sounds.  Exam reveals no gallop and no friction rub.   No murmur heard. Pulmonary/Chest: Effort normal and breath sounds normal. No respiratory distress. He has no wheezes. He has no rales. He exhibits  no tenderness.  Abdominal: Soft. He exhibits no distension and no mass. There is no tenderness. There is no rebound and no guarding.  Musculoskeletal: Normal range of motion. He exhibits no edema or tenderness.  Neurological: He is alert and oriented to person, place, and time.  Skin: Skin is warm and dry.  Psychiatric: He has a normal mood and affect. His behavior is normal. Judgment and thought content normal.  Nursing note and vitals reviewed.    ED Treatments / Results  Labs (all labs ordered are listed, but only abnormal results are displayed) Labs Reviewed  BASIC METABOLIC PANEL  CBC  I-STAT TROPOININ, ED    EKG  EKG Interpretation  Date/Time:  Saturday April 09 2016 20:41:07  EDT Ventricular Rate:  61 PR Interval:    QRS Duration: 96 QT Interval:  422 QTC Calculation: 426 R Axis:   87 Text Interpretation:  Sinus rhythm ST elev, probable normal early repol pattern ? hyperacute T waves No old tracing to compare Confirmed by FLOYD MD, DANIEL [id number]) on 04/09/2016 9:35:51 PM       Radiology No results found.  Procedures Procedures (including critical care time)  Medications Ordered in ED Medications  sodium chloride 0.9 % bolus 1,000 mL (not administered)     Initial Impression / Assessment and Plan / ED Course  I have reviewed the triage vital signs and the nursing notes.  Pertinent labs & imaging results that were available during my care of the patient were reviewed by me and considered in my medical decision making (see chart for details).  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 Will check labs, EKG, chest x-ray.  Trop and EKG are negative.  Doesn't see cardiology or have a PCP.    HEART score is 4 based.  Recommend observation admission for cardiac rule out.  Patient states that he is feeling much better now.  Final Clinical Impressions(s) / ED Diagnoses   Final diagnoses:  Chest pain, unspecified chest pain type    New Prescriptions New Prescriptions   No medications on file     Roxy HorsemanRobert Shadrick Senne, PA-C 04/09/16 2155    Melene Planan Floyd, DO 04/10/16 0002

## 2016-04-10 ENCOUNTER — Observation Stay (HOSPITAL_BASED_OUTPATIENT_CLINIC_OR_DEPARTMENT_OTHER): Payer: Self-pay

## 2016-04-10 ENCOUNTER — Observation Stay (HOSPITAL_COMMUNITY): Payer: Self-pay

## 2016-04-10 DIAGNOSIS — R079 Chest pain, unspecified: Secondary | ICD-10-CM

## 2016-04-10 DIAGNOSIS — R0789 Other chest pain: Secondary | ICD-10-CM

## 2016-04-10 LAB — TROPONIN I
Troponin I: <0.03 ng/mL (ref <0.03)
Troponin I: <0.03 ng/mL (ref <0.03)

## 2016-04-10 LAB — BASIC METABOLIC PANEL
ANION GAP: 3 — AB (ref 5–15)
Anion gap: 7 (ref 5–15)
BUN: 12 mg/dL (ref 6–20)
BUN: 11 mg/dL (ref 6–20)
CALCIUM: 8.5 mg/dL — AB (ref 8.9–10.3)
CHLORIDE: 110 mmol/L (ref 101–111)
CO2: 26 mmol/L (ref 22–32)
CO2: 23 mmol/L (ref 22–32)
Calcium: 8.5 mg/dL — ABNORMAL LOW (ref 8.9–10.3)
Chloride: 107 mmol/L (ref 101–111)
Creatinine, Ser: 0.76 mg/dL (ref 0.61–1.24)
Creatinine, Ser: 0.7 mg/dL (ref 0.61–1.24)
GFR calc Af Amer: >60 mL/min (ref >60)
GFR calc Af Amer: >60 mL/min (ref >60)
GFR calc non Af Amer: >60 mL/min (ref >60)
GLUCOSE: 91 mg/dL (ref 65–99)
GLUCOSE: 78 mg/dL (ref 65–99)
POTASSIUM: 3.2 mmol/L — AB (ref 3.5–5.1)
POTASSIUM: 4.1 mmol/L (ref 3.5–5.1)
Sodium: 140 mmol/L (ref 135–145)
Sodium: 136 mmol/L (ref 135–145)

## 2016-04-10 LAB — NM MYOCAR MULTI W/SPECT W/WALL MOTION / EF
CHL CUP MPHR: 169 {beats}/min
Estimated workload: 1 METS
Peak HR: 88 {beats}/min
Percent HR: 52 %
Rest HR: 48 {beats}/min

## 2016-04-10 LAB — LIPID PANEL
CHOL/HDL RATIO: 4.4 ratio
Cholesterol: 137 mg/dL (ref 0–200)
HDL: 31 mg/dL — AB (ref >40)
LDL CALC: 92 mg/dL (ref 0–99)
Triglycerides: 68 mg/dL (ref <150)
VLDL: 14 mg/dL (ref 0–40)

## 2016-04-10 LAB — CBC
HCT: 38.3 % — ABNORMAL LOW (ref 39.0–52.0)
Hemoglobin: 12.3 g/dL — ABNORMAL LOW (ref 13.0–17.0)
MCH: 29.6 pg (ref 26.0–34.0)
MCHC: 32.1 g/dL (ref 30.0–36.0)
MCV: 92.3 fL (ref 78.0–100.0)
PLATELETS: 191 10*3/uL (ref 150–400)
RBC: 4.15 MIL/uL — AB (ref 4.22–5.81)
RDW: 12.5 % (ref 11.5–15.5)
WBC: 7.3 10*3/uL (ref 4.0–10.5)

## 2016-04-10 LAB — MAGNESIUM: Magnesium: 1.9 mg/dL (ref 1.7–2.4)

## 2016-04-10 LAB — GLUCOSE, CAPILLARY: GLUCOSE-CAPILLARY: 81 mg/dL (ref 65–99)

## 2016-04-10 MED ORDER — REGADENOSON 0.4 MG/5ML IV SOLN
0.4000 mg | Freq: Once | INTRAVENOUS | Status: AC
  Administered 2016-04-10: 0.4 mg via INTRAVENOUS
  Filled 2016-04-10: qty 5

## 2016-04-10 MED ORDER — ATORVASTATIN CALCIUM 40 MG PO TABS: 40.0000 mg | ORAL_TABLET | Freq: Every day | ORAL | Status: DC

## 2016-04-10 MED ORDER — REGADENOSON 0.4 MG/5ML IV SOLN
INTRAVENOUS | Status: DC
Start: 2016-04-10 — End: 2016-04-10
  Filled 2016-04-10: qty 5

## 2016-04-10 MED ORDER — HEPARIN (PORCINE) IN NACL 100-0.45 UNIT/ML-% IJ SOLN: 750.0000 [IU]/h | INTRAMUSCULAR | Status: DC

## 2016-04-10 MED ORDER — TECHNETIUM TC 99M TETROFOSMIN IV KIT
30.0000 | PACK | Freq: Once | INTRAVENOUS | Status: AC | PRN
  Administered 2016-04-10: 30 via INTRAVENOUS

## 2016-04-10 MED ORDER — POTASSIUM CHLORIDE CRYS ER 20 MEQ PO TBCR
40.0000 meq | EXTENDED_RELEASE_TABLET | Freq: Two times a day (BID) | ORAL | Status: DC
  Administered 2016-04-10: 40 meq via ORAL
  Filled 2016-04-10: qty 2

## 2016-04-10 MED ORDER — HEPARIN BOLUS VIA INFUSION
3800.0000 [IU] | Freq: Once | INTRAVENOUS | Status: DC
  Filled 2016-04-10: qty 3800

## 2016-04-10 MED ORDER — ATORVASTATIN CALCIUM 40 MG PO TABS: 20.0000 mg | ORAL_TABLET | Freq: Every day | ORAL | 2 refills | Status: AC

## 2016-04-10 MED ORDER — TECHNETIUM TC 99M TETROFOSMIN IV KIT
10.0000 | PACK | Freq: Once | INTRAVENOUS | Status: AC | PRN
  Administered 2016-04-10: 10 via INTRAVENOUS

## 2016-04-10 NOTE — Progress Notes (Signed)
D/C paperwork given to patient, all questions answered. IV removed. Pt requested to ambulate out and not use a wheelchair.

## 2016-04-10 NOTE — Consult Note (Signed)
Cardiology Consult    Patient ID: Scarlette Calicoarl Scheunemann MRN: [id number], DOB/AGE: [birth year]   Admit date: 04/09/2016 Date of Consult: 04/10/2016  Primary Physician: No PCP Per Patient Reason for Consult: Chest pain  Primary Cardiologist: New Requesting Provider: Dr. Cyndie ChimeGranfortuna  Patient Profile  Mr. Tasia CatchingsZayack is a 50 year old male with a past medical history of CAD, with MI in 2014.   History of Present Illness  Mr. 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  No radiation to arm or jaw.  He sat down inside and became more dizzy. The pain gradually subsided to 3/10, and he was transported to the ED by EMS.  Received 324 mg aspiring by EMS.  In the ED, he was given 1L NS and has been asymptomatic.   He reports an MI in 2014 which was [facility] in [place].  He was not catheterized, but was given a medicine which he cannot recall the name of to take for 3 months afterwards. He also remembers being told he had an MI as a neonate, but does not known the details since he is adopted.  In 2015 he had an episode of chest pain which he was told was due to a panic attack.  That pain was different, feeling like an intense outwards pressure from inside his chest.  He is a current every day smoker. He is unaware of his family history.  Past Medical History   Past Medical History:  Diagnosis Date  . Arthritis   . Inguinal hernia   . MI, old     History reviewed. No pertinent surgical history.   Allergies  Allergies  Allergen Reactions  . Coconut Fatty Acids     unknown    Inpatient Medications    . aspirin EC  81 mg Oral Daily  . potassium chloride  40 mEq Oral BID  . sodium chloride flush  3 mL Intravenous Q12H    Family History    Family History  Problem Relation Age  of Onset  . Adopted: Yes    Social History    Social History   Social History  . Marital status: Single    Spouse name: N/A  . Number of children: N/A  . Years of education: N/A   Occupational History  . Not on file.   Social History Main Topics  . Smoking status: Current Every Day Smoker    Packs/day: 1.00    Years: 40.00    Types: Cigarettes  . Smokeless tobacco: Never Used  . Alcohol use No  . Drug use: No  . Sexual activity: Not on file   Other Topics Concern  . Not on file   Social History Narrative  . No narrative on file     Review of Systems    General:  No chills, fever, night sweats or weight changes.  Cardiovascular: + chest pain, dyspnea on exertion, edema, orthopnea, palpitations, paroxysmal nocturnal dyspnea. Dermatological: No rash, lesions/masses Respiratory: No cough, dyspnea Urologic: No hematuria, dysuria Abdominal:   No nausea, vomiting, diarrhea, bright red blood per rectum, melena, or hematemesis Neurologic:  No visual changes, wkns, changes in mental status. All other systems reviewed and are otherwise negative except as noted above.  Physical Exam    Blood pressure (!) 102/56, pulse 62, temperature 97.7 F (  36.5 C), temperature source Oral, resp. rate 16, height 5\' 10"  (1.778 m), weight 138 lb 3.2 oz (62.7 kg), SpO2 95 %.  General: Pleasant, NAD Psych: Normal affect. Neuro: Alert and oriented X 3. Moves all extremities spontaneously. HEENT: Normal  Neck: Supple without bruits or JVD. Lungs:  Resp regular and unlabored, diminished in bases.  Heart: RRR no s3, s4, or murmurs. Abdomen: Soft, non-tender, non-distended, BS + x 4.  Extremities: No clubbing, cyanosis or edema. DP/PT/Radials 2+ and equal bilaterally.  Labs    Troponin [facility](Point of Care Test)  Recent Labs  04/09/16 2058  TROPIPOC 0.00    Recent Labs  04/09/16 2322 04/10/16 0442  TROPONINI <0.03 <0.03   Lab Results  Component Value Date   WBC 7.3 04/10/2016    HGB 12.3 (L) 04/10/2016   HCT 38.3 (L) 04/10/2016   MCV 92.3 04/10/2016   PLT 191 04/10/2016    Recent Labs Lab 04/10/16 0442  NA 140  K 3.2*  CL 107  CO2 26  BUN 12  CREATININE 0.76  CALCIUM 8.5*  GLUCOSE 91     Radiology Studies    Dg Chest 2 View  Result Date: 04/09/2016 CLINICAL DATA:  50 year old male with chest patent for 1 day. Prior MI 2.5 years ago. Initial encounter. EXAM: CHEST  2 VIEW COMPARISON:  [facility] Abdomen and Pelvis 10/21/2015. FINDINGS: Somewhat large lung volumes with increased AP dimension to the chest. Normal cardiac size and mediastinal contours. Visualized tracheal air column is within normal limits. No pneumothorax, pulmonary edema, pleural effusion or confluent pulmonary opacity. Minimal scoliotic curvature to the thoracic spine. No acute osseous abnormality identified. IMPRESSION: No acute cardiopulmonary abnormality. Electronically Signed   By: Odessa FlemingH  Hall M.D.   On: 04/09/2016 21:18    EKG & Cardiac Imaging    EKG: Sinus brady, early repol   Assessment & Plan  1. Chest pain: Presents with chest pain and associated SOB and nausea. He says that he has had a MI in the past, but never had a heart catheterization or any stents placed. He has been chest pain free since admission. Troponin negative x 3. EKG shows some elevation consistent with early repolarization. He would benefit from nuclear stress test. Will put on for today.   2. HLD: LDL is 92. Would place on moderate dose statin. ASCVD risk is 5.4%.   Signed, Little IshikawaErin E Smith, NP 04/10/2016, 8:05 AM Pager: [phone]  Personally seen and examined. Agree with above.  50 year old admitted with chest discomfort which [facility], sharp, substernal. Troponins have been normal. EKG shows diffuse early polarization (question pericarditis, I do not have an old EKG to compare) pain is currently gone. Liter of fluid obtained. Work on smoking. I do not hear any rubs on  exam. He is very thin male. We will proceed with nuclear stress test for further risk stratification. Nothing by mouth. If stress test is abnormal, proceed with cardiac catheterization.  Donato SchultzMark Myrle Dues, MD

## 2016-04-10 NOTE — Progress Notes (Signed)
ANTICOAGULATION CONSULT NOTE - Initial Consult  Pharmacy Consult for heparin Indication: chest pain/ACS  Allergies  Allergen Reactions  . Coconut Fatty Acids     unknown    Patient Measurements: Height: 5\' 10"  (177.8 cm) Weight: 138 lb 3.2 oz (62.7 kg) IBW/kg (Calculated) : 73  Vital Signs: Temp: 97.7 F (36.5 C) (08/20 0520) Temp Source: Oral (08/20 0520) BP: 102/56 (08/20 0520) Pulse Rate: 62 (08/20 0520)  Labs:  Recent Labs  04/09/16 2052 04/09/16 2322 04/10/16 0442  HGB 12.9*  --  12.3*  HCT 38.9*  --  38.3*  PLT 194  --  191  CREATININE 0.77  --  0.76  TROPONINI  --  <0.03 <0.03    Estimated Creatinine Clearance: 96.9 mL/min (by C-G formula based on SCr of 0.8 mg/dL).   Medical History: Past Medical History:  Diagnosis Date  . Arthritis   . Inguinal hernia   . MI, old     Assessment: 50yo M with h/o CAD presented with acute exertional chest pain. Pharmacy has been consulted to dose heparin. No anticoagulation PTA, CBC wnl, no bleeding documented  Goal of Therapy:  Heparin level 0.3-0.7 units/ml Monitor platelets by anticoagulation protocol: Yes   Plan:  Give 3800 units bolus x 1 Start heparin infusion at 750 units/hr Check anti-Xa level in 6 hours and daily while on heparin Continue to monitor H&H and platelets    Mackie Paienee Ackley, PharmD PGY1 Pharmacy Resident Pager: [phone] 04/10/2016 8:14 AM   I discussed / reviewed the pharmacy note by Dr. Rande LawmanAckley and I agree with the resident's findings and plans as documented.  Toys 'R' UsKimberly Breely Panik, Pharm.D., BCPS Clinical Pharmacist Pager [phone] 04/10/2016 8:47 AM

## 2016-04-10 NOTE — Discharge Summary (Signed)
Name: Kevin Diaz MRN: [id number] DOB: 01/04/1966 50 y.o. PCP: No Pcp Per Patient  Date of Admission: 04/09/2016  8:35 PM Date of Discharge: 04/10/2016 Attending Physician: Levert FeinsteinJames M Granfortuna, MD  Discharge Diagnosis: Chest Pain Active Problems:   Pain in the chest   Discharge Medications:   Medication List    TAKE these medications   atorvastatin 40 MG tablet Commonly known as:  LIPITOR Take 0.5 tablets (20 mg total) by mouth daily at 6 PM.   docusate sodium 100 MG capsule Commonly known as:  COLACE Take 1 capsule (100 mg total) by mouth every 12 (twelve) hours.   HYDROcodone-acetaminophen 5-325 MG tablet Commonly known as:  NORCO Take 1 tablet by mouth every 6 (six) hours as needed for severe pain.   ibuprofen 200 MG tablet Commonly known as:  ADVIL,MOTRIN Take 200 mg by mouth every 6 (six) hours as needed for moderate pain.       Disposition and follow-up:   KevinEdgerrin Diaz [facility] Good [facility].  At the hospital follow up visit please address:  1.   Please f/u on re-occurrence of chest pain Please continue encouraging smoking cessation Please assess tolerance to atorvastatin  2.  Labs / imaging needed at time of follow-up: BMP to check K  3.  Pending labs/ test needing follow-up: none  Follow-up Appointments: Follow-up Information    [facility]. Call today.   Why:  Please call the above number to set up a hospital follow up appointment in 2-4 weeks.  Contact information: [address] [place] [id number] [phone]          Hospital Course by problem list: Active Problems:   Pain in the chest   Chest Pain: Patient presented with an acute onset of exertional chest pain accompanied by shortness of breath, dizziness, and weakness. He was given only aspirin without nitroglycerin per EMS. 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 EKG shows mild  T-wave elevation most likely from early repolarization. His troponins have been negative x3. Due to his CAD history and presentation of exertional chest pain, we pursued a further cardiac workup. Cardiology was consulted and a nuclear stress test was obtained. Results showed an EF 48%, no inducible ischemia, an inferior/inferoseptal scar, and mild inferior wall hypokinesis. No further cardiac work up is indicated. Patient was started on atorvastatin 20mg  daily. He is to f/u in 2-4 weeks and was strongly advised to find a primary care provider. The importance of smoking cessation was discussed.  Hypokalemia: Patient presented with a potassium of 3.4>3.2, which was repleted with PO KDUR. Potassium on d/c was 4.1.  Discharge Vitals:   BP 109/69 (BP Location: Left Arm)   Pulse (!) 57   Temp 97.6 F (36.4 C) (Axillary)   Resp 16   Ht 5\' 10"  (1.778 m)   Wt 62.7 kg (138 lb 3.2 oz)   SpO2 100%   BMI 19.83 kg/m   Pertinent Labs, Studies, and Procedures:  CBC Latest Ref Rng & Units 04/10/2016 04/09/2016  WBC 4.0 - 10.5 K/uL 7.3 7.9  Hemoglobin 13.0 - 17.0 g/dL 12.3(L) 12.9(L)  Hematocrit 39.0 - 52.0 % 38.3(L) 38.9(L)  Platelets 150 - 400 K/uL 191 194   BMP Latest Ref Rng & Units 04/10/2016 04/10/2016 04/09/2016  Glucose 65 - 99 mg/dL 78 91 87  BUN 6 - 20 mg/dL 11 12 16   Creatinine 0.61 - 1.24  mg/dL 1.610.70 0.960.76 0.450.77  Sodium 135 - 145 mmol/L 136 140 137  Potassium 3.5 - 5.1 mmol/L 4.1 3.2(L) 3.4(L)  Chloride 101 - 111 mmol/L 110 107 105  CO2 22 - 32 mmol/L 23 26 25   Calcium 8.9 - 10.3 mg/dL 4.0(J8.5(L) 8.1(X8.5(L) 9.1(Y8.6(L)    Ref. Range 04/09/2016 23:22 04/10/2016 04:42 04/10/2016 10:19  Troponin I Latest Ref Range: <0.03 ng/mL <0.03 <0.03 <0.03    Ref. Range 04/10/2016 07:28  Cholesterol Latest Ref Range: 0 - 200 mg/dL 782137  Triglycerides Latest Ref Range: <150 mg/dL 68  HDL Cholesterol Latest Ref Range: >40 mg/dL 31 (L)  LDL (calc) Latest Ref Range: 0 - 99 mg/dL 92  VLDL Latest Ref Range: 0 - 40 mg/dL 14    Total CHOL/HDL Ratio Latest Units: RATIO 4.4    EKG: mild ST elevations that are likely from early repolarization  CXR 04/09/2016: No acute cardiopulmonary process.  Nuclear stress test 04/10/2016:  -Inferior/ inferoseptal scar with potential component of diaphragmatic attenuation. No evidence of inducible myocardial ischemia. -Mild inferior wall hypokinesis. -Left ventricular ejection fraction 48% -Non invasive risk stratification*: Low   Discharge Instructions: Discharge Instructions    Diet - low sodium heart healthy    Complete by:  As directed   Discharge instructions    Complete by:  As directed   Please take atorvastatin 20mg  once a day to reduce your risk of a heart attack.  It is recommended that you have a primary care physician. If you already have one, please make an appointment with them in 2-3 weeks. If you don't have one, please follow up with the [facility] for a hospital follow up (number attached below), and look into finding a primary care physician.   As we discussed today, it would be very beneficial for you to stop smoking and we have resources to help you quit when you're ready.   Thank you!   Increase activity slowly    Complete by:  As directed      Signed: Nyra MarketGorica Tierre Gerard, MD 04/10/2016, 5:32 PM   Pager: [phone]

## 2016-04-10 NOTE — Progress Notes (Signed)
The patient was seen in nuclear medicine for a Lexiscan myoview. He tolerated the procedure well. No acute ST or TW changes on ECG.    STRESS IMAGES TO FOLLOW  Noor Vidales, NP  [facility]   

## 2016-04-10 NOTE — Progress Notes (Addendum)
   Subjective: Patient states he has been without chest pain since admission. No complaints of shortness of breath, palpitations, nausea or cough.   Objective:  Vital signs in last 24 hours: Vitals:   04/09/16 2245 04/09/16 2253 04/09/16 2311 04/10/16 0520  BP: 113/82  114/66 (!) 102/56  Pulse: (!) 58  (!) 54 62  Resp: 17  16 16   Temp:  98.7 F (37.1 C) 97.8 F (36.6 C) 97.7 F (36.5 C)  TempSrc:  Oral Oral Oral  SpO2: 98%  100% 95%  Weight:   63.6 kg (140 lb 4.8 oz) 62.7 kg (138 lb 3.2 oz)  Height:   5\' 10"  (1.778 m)    Constitutional: NAD, sitting up comfortably in bed CV: RRR, no murmurs, rubs or gallops, no chest tenderness Resp: CTAB, no increased work of breathing Abd: soft, nontender, nondistended, +BS Ext: warm, without edema, 2+pulses throughout  Assessment/Plan:  Active Problems:   Chest pain  Chest Pain: Patient presented with an acute onset of exertional chest pain accompanied by shortness of breath, dizziness, and weakness. He was given only aspirin without nitroglycerin per EMS. 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 EKG shows mild T-wave elevation most likely from early repolarization. His troponins have been negative x3. Due to his CAD history and presentation of exertional chest pain, further cardiac work up is necessary. Cardiology has been consulted and they will proceed with a nuclear stress test later today.  --Nuclear stress test today; NPO for now --LDL 92 but with history; started atorvastatin 40mg  daily; ASCVD risk of 5.4 --tele  Hypokalemia: Patient presented with a potassium of 3.4>3.2. --replete with KDUR 40meq PO --monitor BMP  Dispo: Anticipated discharge in approximately 1-2 day(s).   Nyra MarketGorica Aimar Shrewsbury, MD 04/10/2016, 10:32 AM Pager: [phone]

## 2016-04-10 NOTE — Progress Notes (Signed)
Pt K 3.2 this am, Trop (-).  No CP at present, VSS.  MD advised via text page.  Will continue to monitor closely.

## 2016-07-01 ENCOUNTER — Encounter (HOSPITAL_COMMUNITY): Payer: Self-pay | Admitting: *Deleted

## 2016-07-01 ENCOUNTER — Emergency Department (HOSPITAL_COMMUNITY)
Admission: EM | Admit: 2016-07-01 | Discharge: 2016-07-01 | Disposition: A | Discharge status: Home / Self Care | Payer: Self-pay | Attending: Emergency Medicine | Admitting: Emergency Medicine

## 2016-07-01 ENCOUNTER — Emergency Department (HOSPITAL_COMMUNITY): Payer: Self-pay

## 2016-07-01 DIAGNOSIS — Y9289 Other specified places as the place of occurrence of the external cause: Secondary | ICD-10-CM | POA: Insufficient documentation

## 2016-07-01 DIAGNOSIS — W230XXA Caught, crushed, jammed, or pinched between moving objects, initial encounter: Secondary | ICD-10-CM | POA: Insufficient documentation

## 2016-07-01 DIAGNOSIS — Y9389 Activity, other specified: Secondary | ICD-10-CM | POA: Insufficient documentation

## 2016-07-01 DIAGNOSIS — S60221A Contusion of right hand, initial encounter: Secondary | ICD-10-CM | POA: Insufficient documentation

## 2016-07-01 DIAGNOSIS — F17210 Nicotine dependence, cigarettes, uncomplicated: Secondary | ICD-10-CM | POA: Insufficient documentation

## 2016-07-01 DIAGNOSIS — Y990 Civilian activity done for income or pay: Secondary | ICD-10-CM | POA: Insufficient documentation

## 2016-07-01 MED ORDER — IBUPROFEN 400 MG PO TABS
800.0000 mg | ORAL_TABLET | Freq: Once | ORAL | Status: AC
  Administered 2016-07-01: 800 mg via ORAL
  Filled 2016-07-01: qty 2

## 2016-07-01 MED ORDER — DICLOFENAC SODIUM 50 MG PO TBEC: 50.0000 mg | DELAYED_RELEASE_TABLET | Freq: Two times a day (BID) | ORAL | 0 refills | Status: AC

## 2016-07-01 NOTE — ED Provider Notes (Signed)
[facility]-EMERGENCY DEPT Provider Note   CSN: [id number] Arrival date & time: 07/01/16  1909  By signing my name below, I, Ether GriffinsKayla Croutch, attest that this documentation has been prepared under the direction and in the presence of  [facility]. Damian LeavellNeese, NP. Electronically Signed:Kayla Croutch, ED Scribe. 07/01/16. 10:17 PM.   History   Chief Complaint Chief Complaint  Patient presents with  . Hand Injury    HPI Comments: Kevin Diaz is a 50 y.o. male who presents to the Emergency Department complaining of moderate right hand pain s/p injury that occurred at 1:30PM. Pt states he struck his right hand in between two pipes while working. He denies LOC, head injury, falls. Pt states pain is worsened with palpation. Pt denies taking OTC medications at home to improve symptoms. Pt is not on any daily blood thinners. He denies numbness, additional injuries.   The history is provided by the patient. No language interpreter was used.  Hand Injury   The incident occurred 6 to 12 hours ago. The incident occurred at work. The injury mechanism was a direct blow. The pain is present in the right hand. The quality of the pain is described as aching. The pain is mild. The pain has been constant since the incident. He reports no foreign bodies present. He has tried nothing for the symptoms. The treatment provided no relief.    Past Medical History:  Diagnosis Date  . Arthritis   . Inguinal hernia   . MI, old     Patient Active Problem List   Diagnosis Date Noted  . Pain in the chest 04/09/2016  . 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 Tobacco use disorder 04/09/2016    History reviewed. No pertinent surgical history.     Home Medications    Prior to Admission medications   Medication Sig Start Date End Date Taking? Authorizing Provider  atorvastatin (LIPITOR) 40 MG tablet Take 0.5 tablets (20 mg total) by mouth daily at 6 PM. 04/10/16   Nyra MarketGorica Svalina, MD  diclofenac (VOLTAREN) 50 MG EC  tablet Take 1 tablet (50 mg total) by mouth 2 (two) times daily. 07/01/16   Stoy Fenn Orlene OchM Divine Imber, NP  docusate sodium (COLACE) 100 MG capsule Take 1 capsule (100 mg total) by mouth every 12 (twelve) hours. 02/09/15   Pricilla LovelessScott Goldston, MD  HYDROcodone-acetaminophen (NORCO) 5-325 MG per tablet Take 1 tablet by mouth every 6 (six) hours as needed for severe pain. 02/09/15   Pricilla LovelessScott Goldston, MD  ibuprofen (ADVIL,MOTRIN) 200 MG tablet Take 200 mg by mouth every 6 (six) hours as needed for moderate pain.    Historical Provider, MD    Family History Family History  Problem Relation Age of Onset  . Adopted: Yes    Social History Social History  Substance Use Topics  . Smoking status: Current Every Day Smoker    Packs/day: 1.00    Years: 40.00    Types: Cigarettes  . Smokeless tobacco: Never Used  . Alcohol use No     Allergies   Coconut fatty acids   Review of Systems Review of Systems  Musculoskeletal: Positive for arthralgias.       Right hand pain  Neurological: Negative for numbness.  All other systems reviewed and are negative.    Physical Exam Updated Vital Signs BP 104/56 (BP Location: Left Arm)   Pulse 82   Temp 97.5 F (36.4 C) (Oral)   Resp 16   Ht 5\' 10"  (1.778 m)   Wt 66.7 kg  SpO2 96%   BMI 21.11 kg/m   Physical Exam  Constitutional: He appears well-developed and well-nourished. No distress.  HENT:  Head: Normocephalic and atraumatic.  Eyes: Conjunctivae are normal.  Neck: Neck supple.  Cardiovascular: Normal rate and regular rhythm.   No murmur heard. Radial pulse 2+. Adequate circulation.   Pulmonary/Chest: Effort normal and breath sounds normal.  Musculoskeletal: He exhibits tenderness. He exhibits no deformity.  Hematoma to the dorsum of the right hand near the wrist at the base of the index and long finger. Pain with palpation and with range of motion in the right hand.   Neurological: He is alert.  Skin: Skin is warm and dry.  Psychiatric: He has a  normal mood and affect. His behavior is normal.  Nursing note and vitals reviewed.    ED Treatments / Results  DIAGNOSTIC STUDIES: Oxygen Saturation is 95 % on RA, adequate by my interpretation.   COORDINATION OF CARE: 10:16 PM-Discussed next steps with pt. Pt verbalized understanding and is agreeable with the plan.   Labs (all labs ordered are listed, but only abnormal results are displayed) Labs Reviewed - No data to display  Radiology Dg Hand Complete Right  Result Date: 07/01/2016 CLINICAL DATA:  Trauma to the right hand. Pain and swelling at the base of the metacarpals. Initial encounter. EXAM: RIGHT HAND - COMPLETE 3+ VIEW COMPARISON:  None. FINDINGS: There is no evidence of fracture or dislocation. There is no evidence of arthropathy or other focal bone abnormality. Soft tissues are unremarkable. IMPRESSION: No acute osseous abnormality. Electronically Signed   By: Annia Beltrew  Davis M.D.   On: 07/01/2016 20:31    Procedures Procedures (including critical care time)  Medications Ordered in ED Medications  ibuprofen (ADVIL,MOTRIN) tablet 800 mg (800 mg Oral Given 07/01/16 2231)     Initial Impression / Assessment and Plan / ED Course  I have reviewed the triage vital signs and the nursing notes.  Pertinent imaging results that were available during my care of the patient were reviewed by me and considered in my medical decision making (see chart for details).  Clinical Course     Patient with right hand pain. X-rays reviewed by me revealed no bony abnormality, dislocation, or fracture. Patient is neurovascularly intact distally. Patient given ace wrap in the ED. Discussed RICE and pain medication. Instructed patient to follow up with their primary care provider within 2-3 days. Discussed strict return precautions to the ED. patient appears reliable and expressed understanding to the discharge instructions.    Final Clinical Impressions(s) / ED Diagnoses   Final diagnoses:   Contusion of right hand, initial encounter    New Prescriptions Discharge Medication List as of 07/01/2016 10:12 PM    START taking these medications   Details  diclofenac (VOLTAREN) 50 MG EC tablet Take 1 tablet (50 mg total) by mouth 2 (two) times daily., Starting Fri 07/01/2016, Print      I personally performed the services described in this documentation, which was scribed in my presence. The recorded information has been reviewed and is accurate.     [place]ites[place] M Corbyn Steedman, NP 07/02/16 0201    Charlynne Panderavid Hsienta Yao, MD 07/02/16 [phone]

## 2016-07-01 NOTE — ED Notes (Signed)
Ice pack given to pt for rt hand

## 2016-07-01 NOTE — ED Triage Notes (Signed)
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

## 2018-01-08 ENCOUNTER — Encounter (HOSPITAL_COMMUNITY): Payer: Self-pay | Admitting: Emergency Medicine

## 2018-01-08 ENCOUNTER — Other Ambulatory Visit: Payer: Self-pay

## 2018-01-08 ENCOUNTER — Emergency Department (HOSPITAL_COMMUNITY)
Admission: EM | Admit: 2018-01-08 | Discharge: 2018-01-08 | Disposition: A | Discharge status: Home / Self Care | Payer: Self-pay | Attending: Emergency Medicine | Admitting: Emergency Medicine

## 2018-01-08 ENCOUNTER — Emergency Department (HOSPITAL_COMMUNITY): Payer: Self-pay

## 2018-01-08 DIAGNOSIS — Y929 Unspecified place or not applicable: Secondary | ICD-10-CM | POA: Insufficient documentation

## 2018-01-08 DIAGNOSIS — Z79899 Other long term (current) drug therapy: Secondary | ICD-10-CM | POA: Insufficient documentation

## 2018-01-08 DIAGNOSIS — Y93H3 Activity, building and construction: Secondary | ICD-10-CM | POA: Insufficient documentation

## 2018-01-08 DIAGNOSIS — S60221A Contusion of right hand, initial encounter: Secondary | ICD-10-CM | POA: Insufficient documentation

## 2018-01-08 DIAGNOSIS — Y990 Civilian activity done for income or pay: Secondary | ICD-10-CM | POA: Insufficient documentation

## 2018-01-08 DIAGNOSIS — W231XXA Caught, crushed, jammed, or pinched between stationary objects, initial encounter: Secondary | ICD-10-CM | POA: Insufficient documentation

## 2018-01-08 DIAGNOSIS — F17210 Nicotine dependence, cigarettes, uncomplicated: Secondary | ICD-10-CM | POA: Insufficient documentation

## 2018-01-08 NOTE — ED Triage Notes (Signed)
Patient states that he caught his right hand between a 4x4 and a telephone pole at work today.  He is having pain in his right pinky and hand are very painful.  CSMTs and pulses intact.

## 2018-01-08 NOTE — ED Provider Notes (Signed)
[facility] EMERGENCY DEPARTMENT Provider Note   CSN: [id number] Arrival date & time: 01/08/18  1827     History   Chief Complaint Chief Complaint  Patient presents with  . Hand Injury    HPI Kevin Diaz is a 52 y.o. male.  The history is provided by the patient and medical records. No language interpreter was used.  Hand Injury     Kevin Diaz is a right-hand-dominant 52 y.o. male who presents to the Emergency Department complaining of acute onset of right hand pain which occurred at work just prior to arrival.  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  Pain is mostly to the right pinky and ulnar aspect of the hand.  Does have some minor abrasion to tip of the finger.  His tetanus is up-to-date.  He rinsed it off and put Neosporin on the area prior to arrival.  No other medications taken.  Pain worse with movements and better when still.  Past Medical History:  Diagnosis Date  . Arthritis   . Inguinal hernia   . MI, old     Patient Active Problem List   Diagnosis Date Noted  . Pain in the chest 04/09/2016  . 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 Tobacco use disorder 04/09/2016    History reviewed. No pertinent surgical history.      Home Medications    Prior to Admission medications   Medication Sig Start Date End Date Taking? Authorizing Provider  atorvastatin (LIPITOR) 40 MG tablet Take 0.5 tablets (20 mg total) by mouth daily at 6 PM. 04/10/16   Nyra Market, MD  diclofenac (VOLTAREN) 50 MG EC tablet Take 1 tablet (50 mg total) by mouth 2 (two) times daily. 07/01/16   Janne Napoleon, NP  docusate sodium (COLACE) 100 MG capsule Take 1 capsule (100 mg total) by mouth every 12 (twelve) hours. 02/09/15   Pricilla Loveless, MD  HYDROcodone-acetaminophen (NORCO) 5-325 MG per tablet Take 1 tablet by mouth every 6 (six) hours as needed for severe  pain. 02/09/15   Pricilla Loveless, MD  ibuprofen (ADVIL,MOTRIN) 200 MG tablet Take 200 mg by mouth every 6 (six) hours as needed for moderate pain.    [provider]    Family History Family History  Adopted: Yes    Social History Social History   Tobacco Use  . Smoking status: Current Every Day Smoker    Packs/day: 1.00    Years: 40.00    Pack years: 40.00    Types: Cigarettes  . Smokeless tobacco: Never Used  Substance Use Topics  . Alcohol use: No  . Drug use: No     Allergies   Coconut fatty acids   Review of Systems Review of Systems  Musculoskeletal: Positive for arthralgias and myalgias.  Skin: Positive for wound.  Neurological: Negative for weakness and numbness.     Physical Exam Updated Vital Signs BP 132/68   Pulse 65   Temp 97.8 F (36.6 C) (Oral)   Resp 17   SpO2 99%   Physical Exam  Constitutional: He appears well-developed and well-nourished. No distress.  HENT:  Head: Normocephalic and atraumatic.  Neck: Neck supple.  Cardiovascular: Normal rate, regular rhythm and normal heart sounds.  No murmur heard. Pulmonary/Chest: Effort normal and breath sounds normal. No respiratory distress. He has no wheezes. He has no rales.  Musculoskeletal:  Tenderness to palpation of right pinky finger and fifth metacarpal.  Decreased range of motion likely secondary to pain. No anatomical snuffbox tenderness. Superficial abrasion to the tip of the pinky finger as well. Good cap refill. 2+ radial pulse. Sensation intact to ulnar, median and radial nerve distributions.   Neurological: He is alert.  Skin: Skin is warm and dry.  Nursing note and vitals reviewed.    ED Treatments / Results  Labs (all labs ordered are listed, but only abnormal results are displayed) Labs Reviewed - No data to display  EKG None  Radiology Dg Hand Complete Right  Result Date: 01/08/2018 CLINICAL DATA:  Patient caught right hand between a 4 x 4 and telephone pole  at work today. Pain in the right pinky and fifth metacarpal. EXAM: RIGHT HAND - COMPLETE 3+ VIEW COMPARISON:  07/01/2016 FINDINGS: Remote fifth metacarpal fracture with healing. No acute fracture nor joint dislocation. No radiopaque foreign body or significant soft tissue swelling. IMPRESSION: No acute fracture nor joint dislocation of the right hand and wrist. Electronically Signed   By: Tollie Eth M.D.   On: 01/08/2018 20:54    Procedures Procedures (including critical care time)  Medications Ordered in ED Medications - No data to display   Initial Impression / Assessment and Plan / ED Course  I have reviewed the triage vital signs and the nursing notes.  Pertinent labs & imaging results that were available during my care of the patient were reviewed by me and considered in my medical decision making (see chart for details).    Kevin Diaz is a 52 y.o. male who presents to ED for acute onset of right hand pain after getting hand stuck between a piece of wood in a telephone pole at work today.  Neurovascularly intact on exam.  Does have some superficial abrasions.  Tetanus up-to-date.  Nothing requiring laceration repair.  X-ray obtained negative for fracture or acute injury.  Will treat symptomatically and have him follow-up with orthopedic if symptoms persist.  Reasons to return to ED discussed and all questions answered.    Final Clinical Impressions(s) / ED Diagnoses   Final diagnoses:  Contusion of right hand, initial encounter    ED Discharge Orders    None       Janise Gora, Chase Picket, PA-C 01/08/18 2211    Donnetta Hutching, MD 01/11/18 1501

## 2018-01-08 NOTE — ED Notes (Signed)
Results reviewed.  No changes in acuity at this time 

## 2018-01-08 NOTE — Discharge Instructions (Addendum)
It was my pleasure taking care of you today!   Alternate between Tylenol and Ibuprofen as needed for pain. Ice affected area for pain/swelling relief.  Follow up with the orthopedic doctor if your symptoms are not improving in the next week. You will have to call to schedule the appointment.   Return to ER for new or worsening symptoms, any additional concerns.    COLD THERAPY DIRECTIONS:  Ice or gel packs can be used to reduce both pain and swelling. Ice is the most helpful within the first 24 to 48 hours after an injury or flareup from overusing a muscle or joint.  Ice is effective, has very few side effects, and is safe for most people to use.   If you expose your skin to cold temperatures for too long or without the proper protection, you can damage your skin or nerves. Watch for signs of skin damage due to cold.   HOME CARE INSTRUCTIONS  Follow these tips to use ice and cold packs safely.  Place a dry or damp towel between the ice and skin. A damp towel will cool the skin more quickly, so you may need to shorten the time that the ice is used.  For a more rapid response, add gentle compression to the ice.  Ice for no more than 10 to 20 minutes at a time. The bonier the area you are icing, the less time it will take to get the benefits of ice.  Check your skin after 5 minutes to make sure there are no signs of a poor response to cold or skin damage.  Rest 20 minutes or more in between uses.  Once your skin is numb, you can end your treatment. You can test numbness by very lightly touching your skin. The touch should be so light that you do not see the skin dimple from the pressure of your fingertip. When using ice, most people will feel these normal sensations in this order: cold, burning, aching, and numbness.

## 2018-09-28 ENCOUNTER — Ambulatory Visit: Payer: Self-pay | Admitting: Surgery

## 2018-09-28 NOTE — H&P (Signed)
  History of Present Illness Kevin Diaz. Kevin Witting MD; 09/28/2018 11:35 AM) The patient is a 53 year old male who presents with an inguinal hernia. Self-referred for left inguinal hernia  This is a 53 year old male who is a one pack per day smoker who presents with a 5 year history of an enlarging left inguinal hernia. He chose not to have it repaired previously because of financial reasons. 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 This area has become fairly large and uncomfortable. He has significant difficulties with bowel movements. He does take Colace on a regular basis. The hernia remains partially reducible. There is no involvement of the right side. He has had no previous abdominal surgery.   Allergies [name], [place]; 09/28/2018 10:51 AM) COCONUT Allergies Reconciled  Medication History [name], CMA; 09/28/2018 10:51 AM) Aspirin (81MG  Tablet, Oral) Active. Medications Reconciled  Social History [name], [place]; 09/28/2018 10:49 AM) Caffeine use Coffee. Illicit drug use Remotely quit drug use. No alcohol use Tobacco use Current every day smoker.  Family History [name], [place]; 09/28/2018 10:49 AM) Bleeding disorder Daughter. Colon Polyps Son. Heart Disease Son.  Other Problems [name], [place]; 09/28/2018 10:49 AM) Myocardial infarction     Review of Systems [name] CMA; 09/28/2018 10:49 AM) HEENT Present- Wears glasses/contact lenses. Not Present- Earache, Hearing Loss, Hoarseness, Nose Bleed, Oral Ulcers, Ringing in the Ears, Seasonal Allergies, Sinus Pain, Sore Throat, Visual Disturbances and Yellow Eyes.  Vitals [name] CMA; 09/28/2018 10:50 AM) 09/28/2018 10:50 AM Weight: 144.6 lb Height: 70in Body Surface Area: 1.82 m Body Mass Index: 20.75 kg/m  Temp.: 97.37F  Pulse: 80 (Regular)  BP: 108/68 (Sitting, Left Arm, Standard)      Physical Exam Molli Hazard K. Tomaz Janis MD;  09/28/2018 11:36 AM)  The physical exam findings are as follows: Note:WDWN in NAD Eyes: Pupils equal, round; sclera anicteric HENT: Oral mucosa moist; good dentition Neck: No masses palpated, no thyromegaly Lungs: CTA bilaterally; normal respiratory effort CV: Regular rate and rhythm; no murmurs; extremities well-perfused with no edema Abd: +bowel sounds, soft, non-tender, no palpable organomegaly; no palpable umbilical hernia GU; bilateral descended testes, no testicular masses. No sign of right inguinal hernia. Large visible left inguinal hernia. This is partially reducible. Skin: Warm, dry; no sign of jaundice Psychiatric - alert and oriented x 4; calm mood and affect    Assessment & Plan Molli Hazard K. Aliscia Clayton MD; 09/28/2018 11:11 AM)  INGUINAL HERNIA OF LEFT SIDE WITHOUT OBSTRUCTION OR GANGRENE (K40.90)  Current Plans Schedule for Surgery - Left inguinal hernia repair with mesh. The surgical procedure has been discussed with the patient. Potential risks, benefits, alternative treatments, and expected outcomes have been explained. All of the patient's questions at this time have been answered. The likelihood of reaching the patient's treatment goal is good. The patient understand the proposed surgical procedure and wishes to proceed. Pt Education - Pamphlet Given - Hernia Surgery: discussed with patient and provided information.  Kevin Diaz. Corliss Skains, MD, [facility]  General/ Trauma Surgery Beeper [phone]  09/28/2018 11:36 AM

## 2018-11-27 ENCOUNTER — Other Ambulatory Visit: Payer: Self-pay

## 2018-11-27 ENCOUNTER — Emergency Department (HOSPITAL_COMMUNITY)
Admission: EM | Admit: 2018-11-27 | Discharge: 2018-11-27 | Disposition: A | Discharge status: Home / Self Care | Payer: Worker's Compensation | Attending: Emergency Medicine | Admitting: Emergency Medicine

## 2018-11-27 ENCOUNTER — Emergency Department (HOSPITAL_COMMUNITY): Payer: Worker's Compensation

## 2018-11-27 ENCOUNTER — Encounter (HOSPITAL_COMMUNITY): Payer: Self-pay

## 2018-11-27 DIAGNOSIS — Z79899 Other long term (current) drug therapy: Secondary | ICD-10-CM | POA: Diagnosis not present

## 2018-11-27 DIAGNOSIS — Z7982 Long term (current) use of aspirin: Secondary | ICD-10-CM | POA: Diagnosis not present

## 2018-11-27 DIAGNOSIS — Y990 Civilian activity done for income or pay: Secondary | ICD-10-CM | POA: Insufficient documentation

## 2018-11-27 DIAGNOSIS — M25512 Pain in left shoulder: Secondary | ICD-10-CM | POA: Diagnosis not present

## 2018-11-27 DIAGNOSIS — W208XXA Other cause of strike by thrown, projected or falling object, initial encounter: Secondary | ICD-10-CM | POA: Insufficient documentation

## 2018-11-27 DIAGNOSIS — M542 Cervicalgia: Secondary | ICD-10-CM | POA: Insufficient documentation

## 2018-11-27 DIAGNOSIS — Y939 Activity, unspecified: Secondary | ICD-10-CM | POA: Diagnosis not present

## 2018-11-27 DIAGNOSIS — F17210 Nicotine dependence, cigarettes, uncomplicated: Secondary | ICD-10-CM | POA: Insufficient documentation

## 2018-11-27 DIAGNOSIS — Y929 Unspecified place or not applicable: Secondary | ICD-10-CM | POA: Insufficient documentation

## 2018-11-27 DIAGNOSIS — S0990XA Unspecified injury of head, initial encounter: Secondary | ICD-10-CM | POA: Diagnosis not present

## 2018-11-27 LAB — CBC WITH DIFFERENTIAL/PLATELET
Abs Immature Granulocytes: 0.01 10*3/uL (ref 0.00–0.07)
Basophils Absolute: 0 10*3/uL (ref 0.0–0.1)
Basophils Relative: 0 %
Eosinophils Absolute: 0.1 10*3/uL (ref 0.0–0.5)
Eosinophils Relative: 1 %
HCT: 43.3 % (ref 39.0–52.0)
Hemoglobin: 14.5 g/dL (ref 13.0–17.0)
Immature Granulocytes: 0 %
Lymphocytes Relative: 24 %
Lymphs Abs: 2.2 10*3/uL (ref 0.7–4.0)
MCH: 31.4 pg (ref 26.0–34.0)
MCHC: 33.5 g/dL (ref 30.0–36.0)
MCV: 93.7 fL (ref 80.0–100.0)
Monocytes Absolute: 0.7 10*3/uL (ref 0.1–1.0)
Monocytes Relative: 8 %
Neutro Abs: 6.2 10*3/uL (ref 1.7–7.7)
Neutrophils Relative %: 67 %
Platelets: 246 10*3/uL (ref 150–400)
RBC: 4.62 MIL/uL (ref 4.22–5.81)
RDW: 12.8 % (ref 11.5–15.5)
WBC: 9.3 10*3/uL (ref 4.0–10.5)
nRBC: 0 % (ref 0.0–0.2)

## 2018-11-27 LAB — BASIC METABOLIC PANEL
Anion gap: 6 (ref 5–15)
BUN: 16 mg/dL (ref 6–20)
CO2: 26 mmol/L (ref 22–32)
Calcium: 9.3 mg/dL (ref 8.9–10.3)
Chloride: 107 mmol/L (ref 98–111)
Creatinine, Ser: 0.8 mg/dL (ref 0.61–1.24)
GFR calc Af Amer: >60 mL/min (ref >60)
GFR calc non Af Amer: >60 mL/min (ref >60)
Glucose, Bld: 89 mg/dL (ref 70–99)
Potassium: 4 mmol/L (ref 3.5–5.1)
Sodium: 139 mmol/L (ref 135–145)

## 2018-11-27 MED ORDER — SODIUM CHLORIDE (PF) 0.9 % IJ SOLN
INTRAMUSCULAR | Status: AC
  Filled 2018-11-27: qty 50

## 2018-11-27 MED ORDER — IOHEXOL 350 MG/ML SOLN
75.0000 mL | Freq: Once | INTRAVENOUS | Status: AC | PRN
  Administered 2018-11-27: 100 mL via INTRAVENOUS

## 2018-11-27 NOTE — ED Notes (Signed)
Patient given discharge teaching and verbalized understanding. Patient ambulated out of ED with a steady gait. 

## 2018-11-27 NOTE — ED Provider Notes (Addendum)
[facility]-EMERGENCY DEPT Provider Note   CSN: [id number] Arrival date & time: 11/27/18  1191    History   Chief Complaint Chief Complaint  Patient presents with   object fell on patient    HPI Kevin Diaz is a 53 y.o. male presenting to the emergency department with complaint of cute onset of left shoulder, neck, and head pain that began about 40 minutes prior to arrival.  Patient states he was at work when an 1100 pound gate fell onto his left side.  He states the gate knocked him down, however the weight of the gait was only on him for a few seconds before it was lifted.  He denies LOC.  He has pain to the left head where the gate hit him, as well as the left anterior neck and left shoulder.  He also has an abrasion to his left knee, which he states is very minor.  Patient is not on anticoagulation.  No medications tried prior to arrival for symptoms.  Patient denies vision changes, posterior neck or back pain, chest pain, abdominal pain, numbness or weakness. Last tetanus vaccine was 2.5 years ago     The history is provided by the patient.    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 Authorizing Provider  aspirin EC 81 MG tablet Take 81 mg by mouth at bedtime.   Yes [provider]  atorvastatin (LIPITOR) 40 MG tablet Take 0.5 tablets (20 mg total) by mouth daily at 6 PM. Patient not taking: Reported on 11/27/2018 04/10/16   Nyra Market, MD  diclofenac (VOLTAREN) 50 MG EC tablet Take 1 tablet (50 mg total) by mouth 2 (two) times daily. Patient not  taking: Reported on 11/27/2018 07/01/16   Janne Napoleon, NP  docusate sodium (COLACE) 100 MG capsule Take 1 capsule (100 mg total) by mouth every 12 (twelve) hours. Patient not taking: Reported on 11/27/2018 02/09/15   Pricilla Loveless, MD  HYDROcodone-acetaminophen [facility]) 5-325 MG per tablet Take 1 tablet by mouth every 6 (six) hours as needed for severe pain. Patient not taking: Reported on 11/27/2018 02/09/15   Pricilla Loveless, MD    Family History Family History  Adopted: Yes    Social History Social History   Tobacco Use   Smoking status: Current Every Day Smoker    Packs/day: 1.00    Years: 40.00    Pack years: 40.00    Types: Cigarettes   Smokeless tobacco: Never Used  Substance Use Topics   Alcohol use: No   Drug use: No     Allergies   Coconut fatty acids   Review of Systems Review of Systems  Eyes: Negative for photophobia and visual disturbance.  Respiratory: Negative for shortness of breath.   Cardiovascular: Negative for chest pain.  Gastrointestinal: Negative for abdominal pain.  Musculoskeletal: Positive for arthralgias (left shoulder) and neck pain (anterior left). Negative for back pain.  Skin: Positive for wound.  Neurological: Positive for headaches. Negative for dizziness and  syncope.  Hematological: Does not bruise/bleed easily.  Psychiatric/Behavioral: Negative for confusion.  All other systems reviewed and are negative.    Physical Exam Updated Vital Signs BP 107/76 (BP Location: Right Arm)    Pulse 61    Temp 97.7 F (36.5 C) (Oral)    Resp 15    Ht 5' 10.5" (1.791 m)    Wt 65.8 kg    SpO2 98%    BMI 20.51 kg/m   Physical Exam Vitals signs and nursing note reviewed.  Constitutional:      General: He is not in acute distress.    Appearance: He is well-developed. He is not ill-appearing.  HENT:     Head: Normocephalic.     Comments: There is a hematoma to the left parietal scalp just above the auricle of the ear.  There is a superficial  laceration, about 1 cm in length.  No crepitus.  No battle sign or raccoon eyes.    Right Ear: Tympanic membrane and ear canal normal.     Left Ear: Tympanic membrane and ear canal normal.  Eyes:     Extraocular Movements: Extraocular movements intact.     Conjunctiva/sclera: Conjunctivae normal.     Pupils: Pupils are equal, round, and reactive to light.  Neck:     Comments: There is tenderness to the left anterior lower neck.  No bruising, swelling, or pulsatile mass.  There is no pain to the anterior neck with range of motion.  No midline C-spine tenderness, no bony step-offs or gross deformities. Cardiovascular:     Rate and Rhythm: Normal rate and regular rhythm.  Pulmonary:     Effort: Pulmonary effort is normal. No respiratory distress.     Breath sounds: Normal breath sounds.     Comments: There is no obvious chest wall trauma or tenderness.  Symmetric chest expansion. Chest:     Chest wall: No tenderness.  Abdominal:     General: Bowel sounds are normal. There is no distension.     Palpations: Abdomen is soft.     Tenderness: There is no abdominal tenderness. There is no guarding or rebound.  Musculoskeletal:     Comments: Superficial abrasions to the left superior and posterior aspect of the shoulder.  Associated tenderness.  No obvious deformity.  Patient is able to actively range the shoulder in all directions with minimal pain.  No obvious clavicle deformity or tenderness. No midline spinal or paraspinal tenderness. There is a small superficial abrasion to the left anterior knee.  No swelling or deformity.  Normal range of motion of the knee without pain.  Skin:    General: Skin is warm.  Neurological:     Mental Status: He is alert and oriented to person, place, and time.     Comments: Mental Status:  Alert, oriented, thought content appropriate, able to give a coherent history. Speech fluent without evidence of aphasia. Able to follow 2 step commands without difficulty.    Cranial Nerves:  II:  Peripheral visual fields grossly normal, pupils equal, round, reactive to light III,IV, VI: ptosis not present, extra-ocular motions intact bilaterally  V,VII: smile symmetric, facial light touch sensation equal VIII: hearing grossly normal to voice  X: uvula elevates symmetrically  XI: bilateral shoulder shrug symmetric and strong XII: midline tongue extension without fassiculations Motor:  Normal tone. 5/5 in upper and lower extremities bilaterally including strong and equal grip strength and dorsiflexion/plantar flexion Sensory: Pinprick and light touch normal in all extremities.  Cerebellar:  normal finger-to-nose with bilateral upper extremities Gait: normal gait and balance CV: distal pulses palpable throughout    Psychiatric:        Behavior: Behavior normal.      ED Treatments / Results  Labs (all labs ordered are listed, but only abnormal results are displayed) Labs Reviewed  BASIC METABOLIC PANEL  CBC WITH DIFFERENTIAL/PLATELET    EKG None  Radiology Ct Head Wo Contrast  Result Date: 11/27/2018 CLINICAL DATA:  Heavy object fell on patient. Pain. EXAM: CT HEAD WITHOUT CONTRAST CT CERVICAL SPINE WITHOUT CONTRAST TECHNIQUE: Multidetector CT imaging of the head and cervical spine was performed following the standard protocol without intravenous contrast. Multiplanar CT image reconstructions of the cervical spine were also generated. COMPARISON:  CT angiography neck reported separately. FINDINGS: CT HEAD FINDINGS Brain: No evidence for acute infarction, hemorrhage, mass lesion, hydrocephalus, or extra-axial fluid. Vascular: No hyperdense vessel or unexpected calcification. Skull: Normal. Negative for fracture or focal lesion. Sinuses/Orbits: No acute finding. Other: None. CT CERVICAL SPINE FINDINGS Alignment: Straightening, slight reversal normal cervical lordotic curve. Skull base and vertebrae: No acute fracture. No primary bone lesion or focal  pathologic process. Soft tissues and spinal canal: No prevertebral fluid or swelling. No visible canal hematoma. No visible airway compromise. Disc levels: Disc space narrowing at C5-6 and C6-7 is chronic. There is severe chronic facet arthropathy at C2-3 on the RIGHT, near complete loss of the joint space. Upper chest: Emphysematous change. Other: None. IMPRESSION: 1. No skull fracture or intracranial hemorrhage. 2. No cervical spine fracture or traumatic subluxation. Electronically Signed   By: Elsie Stain M.D.   On: 11/27/2018 11:27   Ct Angio Neck W And/or Wo Contrast  Result Date: 11/27/2018 CLINICAL DATA:  Heavy object fell on patient at work. This hit the LEFT side of the head. Neck pain. EXAM: CT ANGIOGRAPHY NECK TECHNIQUE: Multidetector CT imaging of the neck was performed using the standard protocol during bolus administration of intravenous contrast. Multiplanar CT image reconstructions and MIPs were obtained to evaluate the vascular anatomy. Carotid stenosis measurements (when applicable) are obtained utilizing NASCET criteria, using the distal internal carotid diameter as the denominator. CONTRAST:  OMNIPAQUE IOHEXOL 350 MG/ML SOLN COMPARISON:  CT head and cervical spine reported separately. FINDINGS: Aortic arch: Bovine trunk. Imaged portion shows no evidence of aneurysm or dissection. No significant stenosis of the major arch vessel origins. Right carotid system: No evidence of dissection, stenosis (50% or greater) or occlusion. Left carotid system: No evidence of dissection, stenosis (50% or greater) or occlusion. Vertebral arteries: Codominant. No evidence of dissection, stenosis (50% or greater) or occlusion. Skeleton: Reported separately.  No acute finding.  Poor dentition. Other neck: No neck mass. No foreign body. Upper chest: Emphysematous change at the lung apices. There is no pneumothorax. IMPRESSION: No evidence for vascular injury in the neck. Electronically Signed   By: Elsie Stain M.D.   On: 11/27/2018 11:23   Ct Cervical Spine Wo Contrast  Result Date: 11/27/2018 CLINICAL DATA:  Heavy object fell on patient. Pain. EXAM: CT HEAD WITHOUT CONTRAST CT CERVICAL SPINE WITHOUT CONTRAST TECHNIQUE: Multidetector CT imaging of the head and cervical spine was performed following the standard protocol without intravenous contrast. Multiplanar CT image reconstructions of the cervical spine were also generated. COMPARISON:  CT angiography neck reported separately. FINDINGS: CT HEAD FINDINGS Brain: No evidence for acute infarction, hemorrhage, mass lesion, hydrocephalus, or extra-axial fluid. Vascular: No hyperdense vessel or unexpected calcification. Skull: Normal. Negative for fracture or  focal lesion. Sinuses/Orbits: No acute finding. Other: None. CT CERVICAL SPINE FINDINGS Alignment: Straightening, slight reversal normal cervical lordotic curve. Skull base and vertebrae: No acute fracture. No primary bone lesion or focal pathologic process. Soft tissues and spinal canal: No prevertebral fluid or swelling. No visible canal hematoma. No visible airway compromise. Disc levels: Disc space narrowing at C5-6 and C6-7 is chronic. There is severe chronic facet arthropathy at C2-3 on the RIGHT, near complete loss of the joint space. Upper chest: Emphysematous change. Other: None. IMPRESSION: 1. No skull fracture or intracranial hemorrhage. 2. No cervical spine fracture or traumatic subluxation. Electronically Signed   By: Elsie Stain M.D.   On: 11/27/2018 11:27   Dg Shoulder Left  Result Date: 11/27/2018 CLINICAL DATA:  Trauma to left shoulder.  Diffuse pain. EXAM: LEFT SHOULDER - 2+ VIEW COMPARISON:  Chest x-ray 04/09/2016 FINDINGS: There is no evidence of fracture or dislocation. There is no evidence of arthropathy or other focal bone abnormality. Soft tissues are unremarkable. IMPRESSION: Negative. Electronically Signed   By: Elberta Fortis M.D.   On: 11/27/2018 09:29     Procedures Procedures (including critical care time)  Medications Ordered in ED Medications  sodium chloride (PF) 0.9 % injection (has no administration in time range)  iohexol (OMNIPAQUE) 350 MG/ML injection 75 mL (100 mLs Intravenous Contrast Given 11/27/18 1038)     Initial Impression / Assessment and Plan / ED Course  I have reviewed the triage vital signs and the nursing notes.  Pertinent labs & imaging results that were available during my care of the patient were reviewed by me and considered in my medical decision making (see chart for details).        Patient presenting after 1100 pound gate fell and knocked him over, landing on him briefly while at work today.  He has hematoma to the left parietal scalp just above the ear with superficial abrasion.  Mild associated headache, however no vision changes, nausea, vomiting.  He has anterior neck pain that is worse with swallowing.  Exam without obvious trauma, no bulging, masses, or pain with range of motion.  There are superficial abrasions to the left shoulder, however patient is ranging shoulder normally.  No evidence of chest or abdominal trauma.  No obvious neuro deficits. Pt is in no distress on exam, declining pain medication at this time.   Patient discussed with Dr. Anitra Lauth, will proceed with CT imaging of the head and neck, CTA of the neck to visualize vessels, and plain films of the shoulder.  Imaging is negative.  Patient reevaluated, reports no complaints at this time.  Discussed concussion precautions and symptomatic management at home.  Patient verbalized understanding and agrees with care plan for discharge.  Strict return precautions discussed.  Discussed results, findings, treatment and follow up. Patient advised of return precautions. Patient verbalized understanding and agreed with plan.  Final Clinical Impressions(s) / ED Diagnoses   Final diagnoses:  Work related injury  Acute pain of left shoulder  Mild  closed head injury, initial encounter  Neck pain, acute    ED Discharge Orders    None       Judah Carchi, [place], PA-C 11/27/18 1147    Jamere Stidham, [place], [place] 11/27/18 1148    Gwyneth Sprout, MD 11/28/18 1955

## 2018-11-27 NOTE — Discharge Instructions (Signed)
Please read instructions below. Apply ice to your areas of pain for 20 minutes at a time. You can take 600 mg of Advil/ibuprofen every 6 hours as needed for pain.  Apply ice to your areas of pain for 20 minutes at a time. Schedule an appointment with your primary care provider to follow up on your visit today. Avoid any contact sports/ladder climbing, or other activities such that pose a risk for reinjury of your head. Return to the ER for severely worsening headache, vision changes, persistent vomiting, if new numbness or tingling in your arms or legs, inability to urinate, inability to hold your bowels, or weakness in your extremities.

## 2018-11-27 NOTE — ED Triage Notes (Addendum)
Patient reports that he was trying to unload an 1100 lb gate off of the back of a truck and the gate fell on him on the left side especially the head, neck, shoulder, and throat area.

## 2018-11-27 NOTE — ED Notes (Signed)
Patient transported to X-ray 

## 2019-09-13 IMAGING — CT CT ANGIOGRAPHY NECK
2 of 8 series · 3 of 16 positions shown · IV contrast (omnipaque)
Comparison: CT head and cervical spine reported separately.

CLINICAL DATA: Heavy object fell on patient at work. This hit the
LEFT side of the head. Neck pain.

EXAM:
CT ANGIOGRAPHY NECK
TECHNIQUE: Multidetector CT imaging of the neck was performed using the
standard protocol during bolus administration of intravenous
contrast. Multiplanar CT image reconstructions and MIPs were
obtained to evaluate the vascular anatomy. Carotid stenosis
measurements (when applicable) are obtained utilizing NASCET
criteria, using the distal internal carotid diameter as the
denominator.
CONTRAST:  100mL OMNIPAQUE IOHEXOL 350 MG/ML SOLN

[Series 4: cta neck thins · axial · 0.41mm/px · z∈[-282,-199]mm · 2 of 495 slices shown]
[im 165/495  soft-tissue]
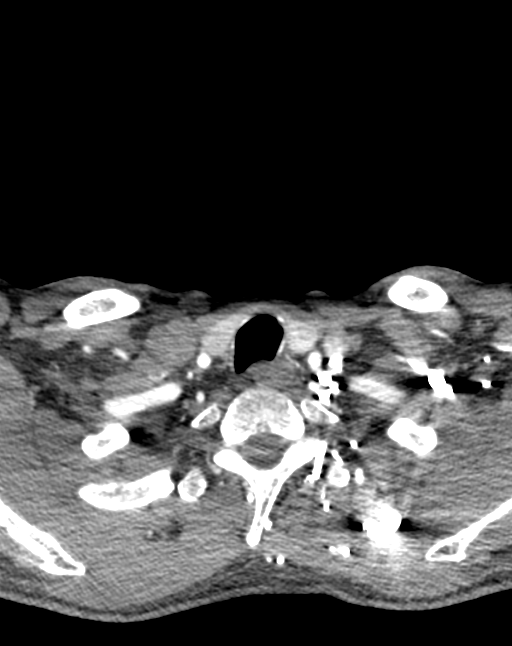
[im 330/495  bone]
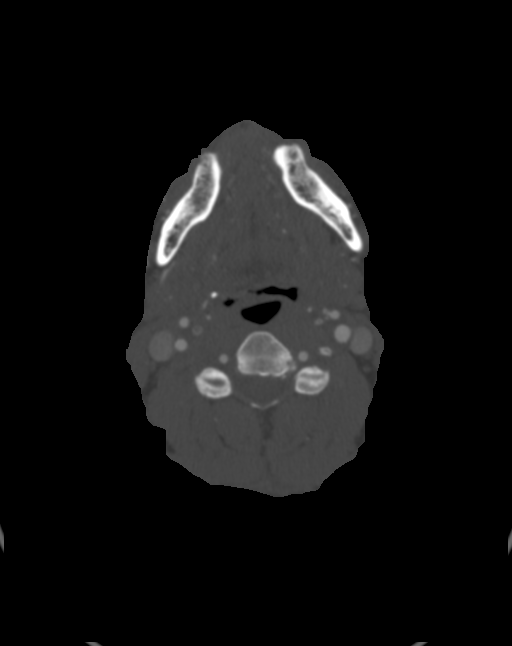

[Series 6: coronal thin · coronal · 0.39mm/px · 1 of 269 slices shown]
[im 135/269  soft-tissue]
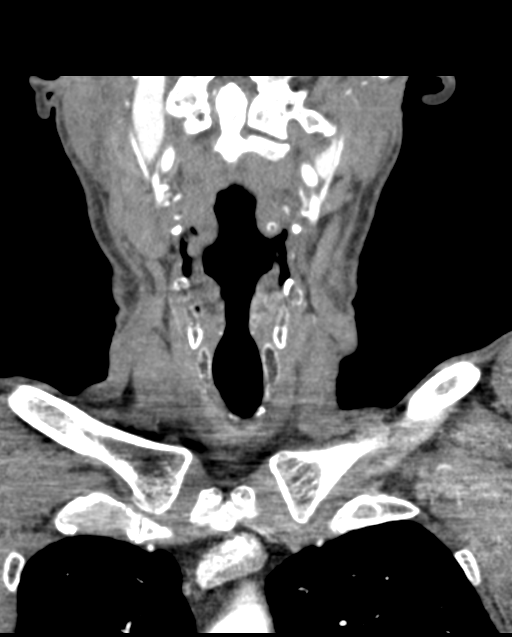

[3 of 16 positions shown; findings below may reference images not displayed]

FINDINGS: Aortic arch: Bovine trunk. Imaged portion shows no evidence of
aneurysm or dissection. No significant stenosis of the major arch
vessel origins.

Right carotid system: No evidence of dissection, stenosis (50% or
greater) or occlusion.

Left carotid system: No evidence of dissection, stenosis (50% or
greater) or occlusion.

Vertebral arteries: Codominant. No evidence of dissection, stenosis
(50% or greater) or occlusion.

Skeleton: Reported separately.  No acute finding.  Poor dentition.

Other neck: No neck mass. No foreign body.

Upper chest: Emphysematous change at the lung apices. There is no
pneumothorax.
IMPRESSION: No evidence for vascular injury in the neck.

## 2022-10-20 ENCOUNTER — Emergency Department (HOSPITAL_COMMUNITY): Payer: Managed Care, Other (non HMO)

## 2022-10-20 ENCOUNTER — Encounter (HOSPITAL_COMMUNITY): Payer: Self-pay

## 2022-10-20 ENCOUNTER — Emergency Department (HOSPITAL_COMMUNITY)
Admission: EM | Admit: 2022-10-20 | Discharge: 2022-10-20 | Disposition: A | Discharge status: Home / Self Care | Payer: Managed Care, Other (non HMO) | Attending: Emergency Medicine | Admitting: Emergency Medicine

## 2022-10-20 ENCOUNTER — Other Ambulatory Visit: Payer: Self-pay

## 2022-10-20 DIAGNOSIS — L8992 Pressure ulcer of unspecified site, stage 2: Secondary | ICD-10-CM | POA: Diagnosis not present

## 2022-10-20 DIAGNOSIS — S8992XA Unspecified injury of left lower leg, initial encounter: Secondary | ICD-10-CM | POA: Diagnosis present

## 2022-10-20 DIAGNOSIS — Z7982 Long term (current) use of aspirin: Secondary | ICD-10-CM | POA: Diagnosis not present

## 2022-10-20 DIAGNOSIS — W2209XA Striking against other stationary object, initial encounter: Secondary | ICD-10-CM | POA: Diagnosis not present

## 2022-10-20 DIAGNOSIS — S8002XA Contusion of left knee, initial encounter: Secondary | ICD-10-CM | POA: Diagnosis not present

## 2022-10-20 DIAGNOSIS — T148XXA Other injury of unspecified body region, initial encounter: Secondary | ICD-10-CM

## 2022-10-20 NOTE — ED Provider Notes (Signed)
[facility] Provider Note   CSN: [id number] Arrival date & time: 10/20/22  1213     History  Chief Complaint  Patient presents with   Knee Injury    Kevin Diaz is a 57 y.o. male.  Patient presents with left knee pain since yesterday while at work.  Patient ran into a pole sticking out of the ground.  Patient hit his left knee causing pain and superficial abrasion no bleeding.  Pain worse with movement.  No fevers, chest pain, shortness of breath or any other injuries.       Home Medications Prior to Admission medications   Medication Sig Start Date End Date Taking? Authorizing Provider  aspirin EC 81 MG tablet Take 81 mg by mouth at bedtime.    [provider]  atorvastatin (LIPITOR) 40 MG tablet Take 0.5 tablets (20 mg total) by mouth daily at 6 PM. Patient not taking: Reported on 11/27/2018 04/10/16   Alphonzo Grieve, MD  diclofenac (VOLTAREN) 50 MG EC tablet Take 1 tablet (50 mg total) by mouth 2 (two) times daily. Patient not taking: Reported on 11/27/2018 07/01/16   Ashley Murrain, NP  docusate sodium (COLACE) 100 MG capsule Take 1 capsule (100 mg total) by mouth every 12 (twelve) hours. Patient not taking: Reported on 11/27/2018 02/09/15   Sherwood Gambler, MD  HYDROcodone-acetaminophen [facility]) 5-325 MG per tablet Take 1 tablet by mouth every 6 (six) hours as needed for severe pain. Patient not taking: Reported on 11/27/2018 02/09/15   Sherwood Gambler, MD      Allergies    Coconut fatty acids    Review of Systems   Review of Systems  Constitutional:  Negative for chills and fever.  HENT:  Negative for congestion.   Eyes:  Negative for visual disturbance.  Respiratory:  Negative for shortness of breath.   Cardiovascular:  Negative for chest pain.  Gastrointestinal:  Negative for abdominal pain and vomiting.  Genitourinary:  Negative for dysuria and flank pain.  Musculoskeletal:  Positive for gait problem. Negative for  back pain, joint swelling, neck pain and neck stiffness.  Skin:  Negative for rash.  Neurological:  Negative for light-headedness and headaches.    Physical Exam Updated Vital Signs BP (!) 106/54 (BP Location: Left Arm)   Pulse 65   Temp 97.8 F (36.6 C) (Oral)   Resp 16   Ht 5' 10.5" (1.791 m)   Wt 65.8 kg   SpO2 99%   BMI 20.52 kg/m  Physical Exam Vitals and nursing note reviewed.  Constitutional:      General: He is not in acute distress.    Appearance: He is well-developed.  HENT:     Head: Normocephalic and atraumatic.     Mouth/Throat:     Mouth: Mucous membranes are moist.  Eyes:     General:        Right eye: No discharge.        Left eye: No discharge.     Conjunctiva/sclera: Conjunctivae normal.  Neck:     Trachea: No tracheal deviation.  Cardiovascular:     Rate and Rhythm: Normal rate.  Pulmonary:     Effort: Pulmonary effort is normal.  Abdominal:     General: There is no distension.     Palpations: Abdomen is soft.     Tenderness: There is no abdominal tenderness. There is no guarding.  Musculoskeletal:        General: Swelling and tenderness present.  No deformity.     Cervical back: Normal range of motion and neck supple. No rigidity.     Comments: Patient has mild tenderness anterior and lateral tibia on the left with minimal swelling.  No knee joint effusion.  Pain with flexion extension of the knee mild.  Superficial abrasion without bleeding left lateral knee area distal to fibular head.  Compartments soft and left lower extremity.  No obvious left knee joint laxity.  Skin:    General: Skin is warm.     Capillary Refill: Capillary refill takes less than 2 seconds.  Neurological:     General: No focal deficit present.     Mental Status: He is alert.  Psychiatric:        Mood and Affect: Mood normal.     ED Results / Procedures / Treatments   Labs (all labs ordered are listed, but only abnormal results are displayed) Labs Reviewed - No data  to display  EKG None  Radiology DG Knee Complete 4 Views Left  Result Date: 10/20/2022 CLINICAL DATA:  Knee pain.  Blunt trauma EXAM: LEFT KNEE - COMPLETE 4+ VIEW COMPARISON:  None Available. FINDINGS: No fracture of the proximal tibia or distal femur. Patella is normal. No joint effusion. IMPRESSION: No fracture or dislocation. Electronically Signed   By: Suzy Bouchard M.D.   On: 10/20/2022 13:21    Procedures Procedures    Medications Ordered in ED Medications - No data to display  ED Course/ Medical Decision Making/ A&P                             Medical Decision Making Amount and/or Complexity of Data Reviewed Radiology: ordered.   Patient presents for assessment since work related injury to left proximal/lateral tibia area.  No evidence of significant knee ligamentous injury.  X-ray ordered and reviewed apparently no acute fracture or dislocation.  Patient stable for discharge with work note, rest, ice, NSAIDs discussed.  Patient comfortable with plan.        Final Clinical Impression(s) / ED Diagnoses Final diagnoses:  Contusion of left knee, initial encounter  Skin abrasion    Rx / DC Orders ED Discharge Orders     None         Elnora Morrison, MD 10/20/22 1743

## 2022-10-20 NOTE — Discharge Instructions (Signed)
Use ice, ibuprofen or Tylenol as needed for pain. Follow-up with your physician if no improvement in 1 week.

## 2022-10-20 NOTE — ED Triage Notes (Signed)
Pt states he was walking around the corner and ran into some poles that were sticking out at work yesterday. Pt states the more he used his left knee, the worse the pain got. Pt has two superficial abrasions on outside of left knee. No swelling noted to knee. Pt states gets a burning sensation when he turns with knee.

## 2022-10-20 NOTE — ED Provider Triage Note (Signed)
Emergency Medicine Provider Triage Evaluation Note  Kevin Diaz , a 57 y.o. male  was evaluated in triage.  Pt complains of left knee pain.  He reports that he was at work yesterday when he ran into a pole sticking out of the ground and hit his left knee.  Reports that he is able to bear weight immediately after this incident.  Reports that pain is worse since then and is exacerbated with any movement.  Denies any obvious swelling or redness to the knee.  Not currently experiencing any chest pain, shortness of breath.  Review of Systems  Positive: As above Negative: As above  Physical Exam  BP (!) 106/54 (BP Location: Left Arm)   Pulse 65   Temp 97.8 F (36.6 C) (Oral)   Resp 16   Ht 5' 10.5" (1.791 m)   Wt 65.8 kg   SpO2 99%   BMI 20.52 kg/m  Gen:   Awake, no distress   Resp:  Normal effort  MSK:   Moves extremities without difficulty  Other:  Tenderness to palpation over the left knee along the lateral aspect.  Few superficial abrasions over the lateral aspect of the left knee.  Medical Decision Making  Medically screening exam initiated at 12:43 PM.  Appropriate orders placed.  Kevin Diaz was informed that the remainder of the evaluation will be completed by another provider, this initial triage assessment does not replace that evaluation, and the importance of remaining in the ED until their evaluation is complete.     Luvenia Heller, PA-C 10/20/22 1244
